# Patient Record
Sex: Male | Born: 1965 | Race: Black or African American | Hispanic: No | Marital: Single | Smoking: Never smoker
Health system: Southern US, Community
[De-identification: ages and names within clinical notes are randomized; demographics above are authoritative.]

## PROBLEM LIST (undated history)

## (undated) DIAGNOSIS — N400 Enlarged prostate without lower urinary tract symptoms: Secondary | ICD-10-CM

## (undated) DIAGNOSIS — Z86718 Personal history of other venous thrombosis and embolism: Secondary | ICD-10-CM

## (undated) HISTORY — DX: Benign prostatic hyperplasia without lower urinary tract symptoms: N40.0

## (undated) HISTORY — DX: Personal history of other venous thrombosis and embolism: Z86.718

## (undated) HISTORY — PX: EYE SURGERY: SHX253

---

## 2008-10-08 ENCOUNTER — Emergency Department: Payer: Self-pay

## 2008-12-24 ENCOUNTER — Emergency Department: Payer: Self-pay | Admitting: Emergency Medicine

## 2009-01-07 ENCOUNTER — Emergency Department: Payer: Self-pay | Admitting: Emergency Medicine

## 2009-11-16 ENCOUNTER — Emergency Department: Payer: Self-pay | Admitting: Emergency Medicine

## 2013-05-14 ENCOUNTER — Ambulatory Visit: Payer: Self-pay

## 2014-07-22 ENCOUNTER — Emergency Department: Payer: Self-pay | Admitting: Emergency Medicine

## 2014-07-22 LAB — CBC WITH DIFFERENTIAL/PLATELET
BASOS ABS: 0 10*3/uL (ref 0.0–0.1)
Basophil %: 0.9 %
Eosinophil #: 0.2 10*3/uL (ref 0.0–0.7)
Eosinophil %: 3.7 %
HCT: 44 % (ref 40.0–52.0)
HGB: 14.8 g/dL (ref 13.0–18.0)
Lymphocyte #: 1.6 10*3/uL (ref 1.0–3.6)
Lymphocyte %: 35.8 %
MCH: 29.6 pg (ref 26.0–34.0)
MCHC: 33.6 g/dL (ref 32.0–36.0)
MCV: 88 fL (ref 80–100)
MONO ABS: 0.3 x10 3/mm (ref 0.2–1.0)
MONOS PCT: 7.3 %
NEUTROS ABS: 2.4 10*3/uL (ref 1.4–6.5)
NEUTROS PCT: 52.3 %
Platelet: 240 10*3/uL (ref 150–440)
RBC: 5 10*6/uL (ref 4.40–5.90)
RDW: 13.6 % (ref 11.5–14.5)
WBC: 4.6 10*3/uL (ref 3.8–10.6)

## 2014-07-22 LAB — BASIC METABOLIC PANEL
Anion Gap: 8 (ref 7–16)
BUN: 16 mg/dL (ref 7–18)
CHLORIDE: 107 mmol/L (ref 98–107)
CO2: 24 mmol/L (ref 21–32)
Calcium, Total: 8.3 mg/dL — ABNORMAL LOW (ref 8.5–10.1)
Creatinine: 1.21 mg/dL (ref 0.60–1.30)
EGFR (African American): >60
EGFR (Non-African Amer.): >60
Glucose: 117 mg/dL — ABNORMAL HIGH (ref 65–99)
OSMOLALITY: 280 (ref 275–301)
POTASSIUM: 3.7 mmol/L (ref 3.5–5.1)
Sodium: 139 mmol/L (ref 136–145)

## 2015-08-22 ENCOUNTER — Ambulatory Visit
Admission: EM | Admit: 2015-08-22 | Discharge: 2015-08-22 | Disposition: A | Discharge status: Home / Self Care | Payer: Self-pay | Attending: Family Medicine | Admitting: Family Medicine

## 2015-08-22 DIAGNOSIS — R634 Abnormal weight loss: Secondary | ICD-10-CM

## 2015-08-22 LAB — GLUCOSE, CAPILLARY: Glucose-Capillary: 86 mg/dL (ref 65–99)

## 2015-08-22 NOTE — ED Provider Notes (Signed)
CSN: [id number]     Arrival date & time 08/22/15  1350 History   None    Chief Complaint  Patient presents with  . Hyperglycemia    Pt asking for a FSBS to check to see if it is normal. He has never been diagnosed with Diabetes, but thinks he has lost weight recently but hasn't weighed himself. He thinks he might be becoming Diabetic and his mother is diabetic. Weight in triage is 15lbs lower than his usual   (Consider location/radiation/quality/duration/timing/severity/associated sxs/prior Treatment) HPI Comments: 50 yo male presents with a concern for diabetes; states his mother is diabetic and would like to have his blood sugar checked. States over the past month he's had about a 12 pounds weight loss, but also has changed his diet and stopped drinking ocassional alcohol. Patient not sure if weight loss due to his diet changes or if he may have diabetes. Denies polyuria, polydipsia, polyphagia. Last ate or drank anything about 7 hours ago.   The history is provided by the patient.    History reviewed. No pertinent past medical history. History reviewed. No pertinent past surgical history. History reviewed. No pertinent family history. Social History  Substance Use Topics  . Smoking status: Never Smoker   . Smokeless tobacco: None  . Alcohol Use: No     Comment: Quit a month ago    Review of Systems  Allergies  Review of patient's allergies indicates no known allergies.  Home Medications   Prior to Admission medications   Not on File   Meds Ordered and Administered this Visit  Medications - No data to display  BP 127/81 mmHg  Pulse 81  Temp(Src) 97.1 F (36.2 C) (Oral)  Resp 18  Ht  (1.88 m)  Wt 233 lb (105.688 kg)  BMI 29.90 kg/m2  SpO2 98% No data found.   Physical Exam  Constitutional: He appears well-developed and well-nourished. No distress.  HENT:  Head: Normocephalic and atraumatic.  Right Ear: Tympanic membrane, external ear and ear canal normal.   Left Ear: Tympanic membrane, external ear and ear canal normal.  Nose: Nose normal.  Mouth/Throat: Uvula is midline, oropharynx is clear and moist and mucous membranes are normal. No oropharyngeal exudate or tonsillar abscesses.  Eyes: Conjunctivae and EOM are normal. Pupils are equal, round, and reactive to light. Right eye exhibits no discharge. Left eye exhibits no discharge. No scleral icterus.  Neck: Normal range of motion. Neck supple. No tracheal deviation present. No thyromegaly present.  Cardiovascular: Normal rate, regular rhythm and normal heart sounds.   Pulmonary/Chest: Effort normal and breath sounds normal. No stridor. No respiratory distress. He has no wheezes. He has no rales. He exhibits no tenderness.  Abdominal: Soft. Bowel sounds are normal. He exhibits no distension.  Musculoskeletal: He exhibits no edema.  Lymphadenopathy:    He has no cervical adenopathy.  Neurological: He is alert.  Skin: Skin is warm and dry. No rash noted. He is not diaphoretic.  Nursing note and vitals reviewed.   ED Course  Procedures (including critical care time)  Labs Review Labs Reviewed  GLUCOSE, CAPILLARY  CBG MONITORING, ED    Imaging Review No results found.   Visual Acuity Review  Right Eye Distance:   Left Eye Distance:   Bilateral Distance:    Right Eye Near:   Left Eye Near:    Bilateral Near:      Fingerstick blood glucose: 86   MDM   1. Weight loss  Likely due to recent dietary modifications. Discussed blood glucose result with patient. Recommend patient establish care with a PCP for further follow up/work up.    Payton Mccallum, MD 08/22/15 [phone]

## 2016-04-14 ENCOUNTER — Ambulatory Visit (INDEPENDENT_AMBULATORY_CARE_PROVIDER_SITE_OTHER): Payer: BLUE CROSS/BLUE SHIELD | Admitting: Unknown Physician Specialty

## 2016-04-14 ENCOUNTER — Encounter: Payer: Self-pay | Admitting: Unknown Physician Specialty

## 2016-04-14 VITALS — BP 129/80 | HR 83 | Temp 98.0°F | Ht 73.9 in | Wt 244.2 lb

## 2016-04-14 DIAGNOSIS — Z0000 Encounter for general adult medical examination without abnormal findings: Secondary | ICD-10-CM

## 2016-04-14 NOTE — Progress Notes (Signed)
BP 129/80 (BP Location: Left Arm, Patient Position: Sitting, Cuff Size: Large)   Pulse 83   Temp 98 F (36.7 C)   Ht 6' 1.9" (1.877 m)   Wt 244 lb 3.2 oz (110.8 kg)   SpO2 98%   BMI 31.44 kg/m    Subjective:    Patient ID: Juan Russell, male    DOB: 10-Apr-1966, 50 y.o.   MRN: [id number]  HPI: Juan Russell is a 50 y.o. male  Chief Complaint  Patient presents with  . Labs Only    pt states he would like his PSA and sugar checked   Pt states he would like his blood sugar and PSA checked.  He does not want a physical exam.  He is concerned about diabetes history in the family  Social History   Social History  . Marital status: Single    Spouse name: N/A  . Number of children: N/A  . Years of education: N/A   Occupational History  . Not on file.   Social History Main Topics  . Smoking status: Never Smoker  . Smokeless tobacco: Never Used  . Alcohol use No     Comment: Quit a month ago  . Drug use: No  . Sexual activity: Not Currently   Other Topics Concern  . Not on file   Social History Narrative  . No narrative on file   Family History  Problem Relation Age of Onset  . Diabetes Mother   . Alcohol abuse Father   . Stroke Father   . Asthma Brother   . Hypertension Brother   . Cirrhosis Brother    Past Medical History:  Diagnosis Date  . BPH (benign prostatic hyperplasia)    Past Surgical History:  Procedure Laterality Date  . EYE SURGERY     as a child    Relevant past medical, surgical, family and social history reviewed and updated as indicated. Interim medical history since our last visit reviewed. Allergies and medications reviewed and updated.  Review of Systems  Constitutional: Negative.   HENT: Negative.   Eyes: Negative.   Respiratory: Negative.   Cardiovascular: Negative.   Gastrointestinal: Negative.   Endocrine: Negative.   Genitourinary: Negative.   Skin: Negative.   Allergic/Immunologic: Negative.   Neurological: Negative.     Hematological: Negative.   Psychiatric/Behavioral: Negative.     Per HPI unless specifically indicated above     Objective:    BP 129/80 (BP Location: Left Arm, Patient Position: Sitting, Cuff Size: Large)   Pulse 83   Temp 98 F (36.7 C)   Ht 6' 1.9" (1.877 m)   Wt 244 lb 3.2 oz (110.8 kg)   SpO2 98%   BMI 31.44 kg/m   Wt Readings from Last 3 Encounters:  04/14/16 244 lb 3.2 oz (110.8 kg)  04/16/13 244 lb (110.7 kg)  08/22/15 233 lb (105.7 kg)    Physical Exam  Constitutional: He is oriented to person, place, and time. He appears well-developed and well-nourished. No distress.  HENT:  Head: Normocephalic and atraumatic.  Eyes: Conjunctivae and lids are normal. Right eye exhibits no discharge. Left eye exhibits no discharge. No scleral icterus.  Neck: Normal range of motion. Neck supple. No JVD present. Carotid bruit is not present.  Cardiovascular: Normal rate, regular rhythm and normal heart sounds.   Pulmonary/Chest: Effort normal and breath sounds normal. No respiratory distress.  Abdominal: Normal appearance. There is no splenomegaly or hepatomegaly.  Musculoskeletal: Normal range of  motion.  Neurological: He is alert and oriented to person, place, and time.  Skin: Skin is warm, dry and intact. No rash noted. No pallor.  Psychiatric: He has a normal mood and affect. His behavior is normal. Judgment and thought content normal.    Results for orders placed or performed during the hospital encounter of 08/22/15  Glucose, capillary  Result Value Ref Range   Glucose-Capillary 86 65 - 99 mg/dL      Assessment & Plan:   Problem List Items Addressed This Visit    None    Visit Diagnoses    Annual physical exam    -  Primary   Relevant Orders   CBC with Differential/Platelet   Comprehensive metabolic panel   Lipid Panel w/o Chol/HDL Ratio   PSA   TSH       Follow up plan: Return in about 1 year (around 04/14/2017).

## 2016-04-15 LAB — TSH: TSH: 1.85 u[IU]/mL (ref 0.450–4.500)

## 2016-04-15 LAB — COMPREHENSIVE METABOLIC PANEL
A/G RATIO: 1.6 (ref 1.2–2.2)
ALBUMIN: 4.1 g/dL (ref 3.5–5.5)
ALK PHOS: 90 IU/L (ref 39–117)
ALT: 20 IU/L (ref 0–44)
AST: 20 IU/L (ref 0–40)
BILIRUBIN TOTAL: 0.3 mg/dL (ref 0.0–1.2)
BUN / CREAT RATIO: 16 (ref 9–20)
BUN: 21 mg/dL (ref 6–24)
CHLORIDE: 103 mmol/L (ref 96–106)
CO2: 23 mmol/L (ref 18–29)
CREATININE: 1.28 mg/dL — AB (ref 0.76–1.27)
Calcium: 9.1 mg/dL (ref 8.7–10.2)
GFR calc Af Amer: 75 mL/min/{1.73_m2} (ref >59)
GFR calc non Af Amer: 65 mL/min/{1.73_m2} (ref >59)
GLOBULIN, TOTAL: 2.6 g/dL (ref 1.5–4.5)
Glucose: 129 mg/dL — ABNORMAL HIGH (ref 65–99)
POTASSIUM: 4 mmol/L (ref 3.5–5.2)
SODIUM: 142 mmol/L (ref 134–144)
Total Protein: 6.7 g/dL (ref 6.0–8.5)

## 2016-04-15 LAB — CBC WITH DIFFERENTIAL/PLATELET
Basophils Absolute: 0 10*3/uL (ref 0.0–0.2)
Basos: 0 %
EOS (ABSOLUTE): 0.2 10*3/uL (ref 0.0–0.4)
Eos: 4 %
HEMOGLOBIN: 13.8 g/dL (ref 12.6–17.7)
Hematocrit: 40 % (ref 37.5–51.0)
Immature Grans (Abs): 0 10*3/uL (ref 0.0–0.1)
Immature Granulocytes: 1 %
LYMPHS: 39 %
Lymphocytes Absolute: 1.6 10*3/uL (ref 0.7–3.1)
MCH: 28.8 pg (ref 26.6–33.0)
MCHC: 34.5 g/dL (ref 31.5–35.7)
MCV: 84 fL (ref 79–97)
MONOCYTES: 9 %
Monocytes Absolute: 0.4 10*3/uL (ref 0.1–0.9)
Neutrophils Absolute: 2 10*3/uL (ref 1.4–7.0)
Neutrophils: 47 %
Platelets: 261 10*3/uL (ref 150–379)
RBC: 4.79 x10E6/uL (ref 4.14–5.80)
RDW: 14.2 % (ref 12.3–15.4)
WBC: 4.1 10*3/uL (ref 3.4–10.8)

## 2016-04-15 LAB — LIPID PANEL W/O CHOL/HDL RATIO
CHOLESTEROL TOTAL: 225 mg/dL — AB (ref 100–199)
HDL: 42 mg/dL (ref >39)
LDL CALC: 145 mg/dL — AB (ref 0–99)
Triglycerides: 189 mg/dL — ABNORMAL HIGH (ref 0–149)
VLDL Cholesterol Cal: 38 mg/dL (ref 5–40)

## 2016-04-15 LAB — PSA: Prostate Specific Ag, Serum: 0.4 ng/mL (ref 0.0–4.0)

## 2016-04-17 ENCOUNTER — Encounter: Payer: Self-pay | Admitting: Unknown Physician Specialty

## 2016-04-28 ENCOUNTER — Telehealth: Payer: Self-pay | Admitting: Unknown Physician Specialty

## 2016-04-28 NOTE — Telephone Encounter (Signed)
Letter was mailed to patient. Left message to call.

## 2016-04-28 NOTE — Telephone Encounter (Signed)
Pt called stated he would a call back. Pt would like to know lab results. Please call ASAP. Thanks.

## 2016-05-01 NOTE — Telephone Encounter (Signed)
Left message to call.

## 2017-04-20 ENCOUNTER — Encounter: Payer: BLUE CROSS/BLUE SHIELD | Admitting: Unknown Physician Specialty

## 2017-06-27 ENCOUNTER — Ambulatory Visit: Payer: BLUE CROSS/BLUE SHIELD | Admitting: Unknown Physician Specialty

## 2017-06-27 ENCOUNTER — Encounter: Payer: Self-pay | Admitting: Unknown Physician Specialty

## 2017-06-27 DIAGNOSIS — E7800 Pure hypercholesterolemia, unspecified: Secondary | ICD-10-CM | POA: Diagnosis not present

## 2017-06-27 DIAGNOSIS — Z125 Encounter for screening for malignant neoplasm of prostate: Secondary | ICD-10-CM | POA: Diagnosis not present

## 2017-06-27 DIAGNOSIS — I10 Essential (primary) hypertension: Secondary | ICD-10-CM

## 2017-06-27 NOTE — Progress Notes (Signed)
BP 129/87 (BP Location: Left Arm, Cuff Size: Large)   Pulse 72   Temp 97.6 F (36.4 C) (Oral)   Wt 254 lb 4.8 oz (115.3 kg)   SpO2 98%   BMI 32.74 kg/m    Subjective:    Patient ID: Juan Russell, male    DOB: 07/30/1965, 51 y.o.   MRN: [id number]  HPI: Juan StammerJay V Weismann is a 51 y.o. male  Chief Complaint  Patient presents with  . Labs Only    pt states he wants to have his PSA checked. States he saw some blood on his underwear last week, not sure if it was from rectum or penis. States he only saw the blood once and has no urinary symptoms/   Hypertension High today.  States it's from [place] the [employer] twice a week Using medications without difficulty Average home BPs Not checking  No problems or lightheadedness No chest pain with exertion or shortness of breath No Edema   Hyperlipidemia No medications  The 10-year ASCVD risk score Denman George(Goff DC Jr., et al., 2013) is: 5.9%   Values used to calculate the score:     Age: [age] years     Sex: Male     Is Non-Hispanic African American: Yes     Diabetic: No     Tobacco smoker: No     Systolic Blood Pressure: 129 mmHg     Is BP treated: No     HDL Cholesterol: 42 mg/dL     Total Cholesterol: 225 mg/dL   Relevant past medical, surgical, family and social history reviewed and updated as indicated. Interim medical history since our last visit reviewed. Allergies and medications reviewed and updated.  Review of Systems  Per HPI unless specifically indicated above     Objective:    BP 129/87 (BP Location: Left Arm, Cuff Size: Large)   Pulse 72   Temp 97.6 F (36.4 C) (Oral)   Wt 254 lb 4.8 oz (115.3 kg)   SpO2 98%   BMI 32.74 kg/m   Wt Readings from Last 3 Encounters:  06/27/17 254 lb 4.8 oz (115.3 kg)  04/14/16 244 lb 3.2 oz (110.8 kg)  04/16/13 244 lb (110.7 kg)    Physical Exam  Constitutional: He is oriented to person, place, and time. He appears well-developed and well-nourished. No distress.  HENT:   Head: Normocephalic and atraumatic.  Eyes: Conjunctivae and lids are normal. Right eye exhibits no discharge. Left eye exhibits no discharge. No scleral icterus.  Neck: Normal range of motion. Neck supple. No JVD present. Carotid bruit is not present.  Cardiovascular: Normal rate, regular rhythm and normal heart sounds.  Pulmonary/Chest: Effort normal and breath sounds normal. No respiratory distress.  Abdominal: Normal appearance. There is no splenomegaly or hepatomegaly.  Musculoskeletal: Normal range of motion.  Neurological: He is alert and oriented to person, place, and time.  Skin: Skin is warm, dry and intact. No rash noted. No pallor.  Psychiatric: He has a normal mood and affect. His behavior is normal. Judgment and thought content normal.    Results for orders placed or performed in visit on 04/14/16  CBC with Differential/Platelet  Result Value Ref Range   WBC 4.1 3.4 - 10.8 x10E3/uL   RBC 4.79 4.14 - 5.80 x10E6/uL   Hemoglobin 13.8 12.6 - 17.7 g/dL   Hematocrit 40.940.0 81.137.5 - 51.0 %   MCV 84 79 - 97 fL   MCH 28.8 26.6 - 33.0 pg  MCHC 34.5 31.5 - 35.7 g/dL   RDW 16.114.2 09.612.3 - 04.515.4 %   Platelets 261 150 - 379 x10E3/uL   Neutrophils 47 Not Estab. %   Lymphs 39 Not Estab. %   Monocytes 9 Not Estab. %   Eos 4 Not Estab. %   Basos 0 Not Estab. %   Neutrophils Absolute 2.0 1.4 - 7.0 x10E3/uL   Lymphocytes Absolute 1.6 0.7 - 3.1 x10E3/uL   Monocytes Absolute 0.4 0.1 - 0.9 x10E3/uL   EOS (ABSOLUTE) 0.2 0.0 - 0.4 x10E3/uL   Basophils Absolute 0.0 0.0 - 0.2 x10E3/uL   Immature Granulocytes 1 Not Estab. %   Immature Grans (Abs) 0.0 0.0 - 0.1 x10E3/uL  Comprehensive metabolic panel  Result Value Ref Range   Glucose 129 (H) 65 - 99 mg/dL   BUN 21 6 - 24 mg/dL   Creatinine, Ser 4.091.28 (H) 0.76 - 1.27 mg/dL   GFR calc non Af Amer 65 >59 mL/min/1.73   GFR calc Af Amer 75 >59 mL/min/1.73   BUN/Creatinine Ratio 16 9 - 20   Sodium 142 134 - 144 mmol/L   Potassium 4.0 3.5 - 5.2 mmol/L    Chloride 103 96 - 106 mmol/L   CO2 23 18 - 29 mmol/L   Calcium 9.1 8.7 - 10.2 mg/dL   Total Protein 6.7 6.0 - 8.5 g/dL   Albumin 4.1 3.5 - 5.5 g/dL   Globulin, Total 2.6 1.5 - 4.5 g/dL   Albumin/Globulin Ratio 1.6 1.2 - 2.2   Bilirubin Total 0.3 0.0 - 1.2 mg/dL   Alkaline Phosphatase 90 39 - 117 IU/L   AST 20 0 - 40 IU/L   ALT 20 0 - 44 IU/L  Lipid Panel w/o Chol/HDL Ratio  Result Value Ref Range   Cholesterol, Total 225 (H) 100 - 199 mg/dL   Triglycerides 811189 (H) 0 - 149 mg/dL   HDL 42 >91>39 mg/dL   VLDL Cholesterol Cal 38 5 - 40 mg/dL   LDL Calculated 478145 (H) 0 - 99 mg/dL  PSA  Result Value Ref Range   Prostate Specific Ag, Serum 0.4 0.0 - 4.0 ng/mL  TSH  Result Value Ref Range   TSH 1.850 0.450 - 4.500 uIU/mL      Assessment & Plan:   Problem List Items Addressed This Visit      Unprioritized   Hypercholesteremia    LDL over 145 last visit      Relevant Orders   Lipid Panel w/o Chol/HDL Ratio   Hypertension    Elevated today.  This is the first time.  Encouraged to check at home      Prostate cancer screening   Relevant Orders   PSA       Follow up plan: Return in about 1 year (around 06/27/2018).

## 2017-06-27 NOTE — Assessment & Plan Note (Addendum)
Elevated today.  This is the first time.  Encouraged to check at home and to RTC if continued elevations.  /DASH diet

## 2017-06-27 NOTE — Assessment & Plan Note (Signed)
LDL over 145 last visit

## 2017-06-27 NOTE — Patient Instructions (Addendum)
DASH Eating Plan DASH stands for "Dietary Approaches to Stop Hypertension." The DASH eating plan is a healthy eating plan that has been shown to reduce high blood pressure (hypertension). It may also reduce your risk for type 2 diabetes, heart disease, and stroke. The DASH eating plan may also help with weight loss. What are tips for following this plan? General guidelines  Avoid eating more than 2,300 mg (milligrams) of salt (sodium) a day. If you have hypertension, you may need to reduce your sodium intake to 1,500 mg a day.  Limit alcohol intake to no more than 1 drink a day for nonpregnant women and 2 drinks a day for men. One drink equals 12 oz of beer, 5 oz of wine, or 1 oz of hard liquor.  Work with your health care provider to maintain a healthy body weight or to lose weight. Ask what an ideal weight is for you.  Get at least 30 minutes of exercise that causes your heart to beat faster (aerobic exercise) most days of the week. Activities may include walking, swimming, or biking.  Work with your health care provider or diet and nutrition specialist (dietitian) to adjust your eating plan to your individual calorie needs. Reading food labels  Check food labels for the amount of sodium per serving. Choose foods with less than 5 percent of the Daily Value of sodium. Generally, foods with less than 300 mg of sodium per serving fit into this eating plan.  To find whole grains, look for the word "whole" as the first word in the ingredient list. Shopping  Buy products labeled as "low-sodium" or "no salt added."  Buy fresh foods. Avoid canned foods and premade or frozen meals. Cooking  Avoid adding salt when cooking. Use salt-free seasonings or herbs instead of table salt or sea salt. Check with your health care provider or pharmacist before using salt substitutes.  Do not fry foods. Cook foods using healthy methods such as baking, boiling, grilling, and broiling instead.  Cook with  heart-healthy oils, such as olive, canola, soybean, or sunflower oil. Meal planning   Eat a balanced diet that includes: ? 5 or more servings of fruits and vegetables each day. At each meal, try to fill half of your plate with fruits and vegetables. ? Up to 6-8 servings of whole grains each day. ? Less than 6 oz of lean meat, poultry, or fish each day. A 3-oz serving of meat is about the same size as a deck of cards. One egg equals 1 oz. ? 2 servings of low-fat dairy each day. ? A serving of nuts, seeds, or beans 5 times each week. ? Heart-healthy fats. Healthy fats called Omega-3 fatty acids are found in foods such as flaxseeds and coldwater fish, like sardines, salmon, and mackerel.  Limit how much you eat of the following: ? Canned or prepackaged foods. ? Food that is high in trans fat, such as fried foods. ? Food that is high in saturated fat, such as fatty meat. ? Sweets, desserts, sugary drinks, and other foods with added sugar. ? Full-fat dairy products.  Do not salt foods before eating.  Try to eat at least 2 vegetarian meals each week.  Eat more home-cooked food and less restaurant, buffet, and fast food.  When eating at a restaurant, ask that your food be prepared with less salt or no salt, if possible. What foods are recommended? The items listed may not be a complete list. Talk with your dietitian about what   dietary choices are best for you. Grains Whole-grain or whole-wheat bread. Whole-grain or whole-wheat pasta. Brown rice. Oatmeal. Quinoa. Bulgur. Whole-grain and low-sodium cereals. Pita bread. Low-fat, low-sodium crackers. Whole-wheat flour tortillas. Vegetables Fresh or frozen vegetables (raw, steamed, roasted, or grilled). Low-sodium or reduced-sodium tomato and vegetable juice. Low-sodium or reduced-sodium tomato sauce and tomato paste. Low-sodium or reduced-sodium canned vegetables. Fruits All fresh, dried, or frozen fruit. Canned fruit in natural juice (without  added sugar). Meat and other protein foods Skinless chicken or turkey. Ground chicken or turkey. Pork with fat trimmed off. Fish and seafood. Egg whites. Dried beans, peas, or lentils. Unsalted nuts, nut butters, and seeds. Unsalted canned beans. Lean cuts of beef with fat trimmed off. Low-sodium, lean deli meat. Dairy Low-fat (1%) or fat-free (skim) milk. Fat-free, low-fat, or reduced-fat cheeses. Nonfat, low-sodium ricotta or cottage cheese. Low-fat or nonfat yogurt. Low-fat, low-sodium cheese. Fats and oils Soft margarine without trans fats. Vegetable oil. Low-fat, reduced-fat, or light mayonnaise and salad dressings (reduced-sodium). Canola, safflower, olive, soybean, and sunflower oils. Avocado. Seasoning and other foods Herbs. Spices. Seasoning mixes without salt. Unsalted popcorn and pretzels. Fat-free sweets. What foods are not recommended? The items listed may not be a complete list. Talk with your dietitian about what dietary choices are best for you. Grains Baked goods made with fat, such as croissants, muffins, or some breads. Dry pasta or rice meal packs. Vegetables Creamed or fried vegetables. Vegetables in a cheese sauce. Regular canned vegetables (not low-sodium or reduced-sodium). Regular canned tomato sauce and paste (not low-sodium or reduced-sodium). Regular tomato and vegetable juice (not low-sodium or reduced-sodium). Pickles. Olives. Fruits Canned fruit in a light or heavy syrup. Fried fruit. Fruit in cream or butter sauce. Meat and other protein foods Fatty cuts of meat. Ribs. Fried meat. Bacon. Sausage. Bologna and other processed lunch meats. Salami. Fatback. Hotdogs. Bratwurst. Salted nuts and seeds. Canned beans with added salt. Canned or smoked fish. Whole eggs or egg yolks. Chicken or turkey with skin. Dairy Whole or 2% milk, cream, and half-and-half. Whole or full-fat cream cheese. Whole-fat or sweetened yogurt. Full-fat cheese. Nondairy creamers. Whipped toppings.  Processed cheese and cheese spreads. Fats and oils Butter. Stick margarine. Lard. Shortening. Ghee. Bacon fat. Tropical oils, such as coconut, palm kernel, or palm oil. Seasoning and other foods Salted popcorn and pretzels. Onion salt, garlic salt, seasoned salt, table salt, and sea salt. Worcestershire sauce. Tartar sauce. Barbecue sauce. Teriyaki sauce. Soy sauce, including reduced-sodium. Steak sauce. Canned and packaged gravies. Fish sauce. Oyster sauce. Cocktail sauce. Horseradish that you find on the shelf. Ketchup. Mustard. Meat flavorings and tenderizers. Bouillon cubes. Hot sauce and Tabasco sauce. Premade or packaged marinades. Premade or packaged taco seasonings. Relishes. Regular salad dressings. Where to find more information:  National Heart, Lung, and Blood Institute: www.nhlbi.nih.gov  American Heart Association: www.heart.org Summary  The DASH eating plan is a healthy eating plan that has been shown to reduce high blood pressure (hypertension). It may also reduce your risk for type 2 diabetes, heart disease, and stroke.  With the DASH eating plan, you should limit salt (sodium) intake to 2,300 mg a day. If you have hypertension, you may need to reduce your sodium intake to 1,500 mg a day.  When on the DASH eating plan, aim to eat more fresh fruits and vegetables, whole grains, lean proteins, low-fat dairy, and heart-healthy fats.  Work with your health care provider or diet and nutrition specialist (dietitian) to adjust your eating plan to your individual   calorie needs. This information is not intended to replace advice given to you by your health care provider. Make sure you discuss any questions you have with your health care provider. Document Released: 06/15/2011 Document Revised: 06/19/2016 Document Reviewed: 06/19/2016 Elsevier Interactive Patient Education  2018 [employer]  

## 2017-06-28 LAB — PSA: Prostate Specific Ag, Serum: 0.4 ng/mL (ref 0.0–4.0)

## 2017-06-28 LAB — LIPID PANEL W/O CHOL/HDL RATIO
CHOLESTEROL TOTAL: 237 mg/dL — AB (ref 100–199)
HDL: 43 mg/dL (ref >39)
LDL CALC: 156 mg/dL — AB (ref 0–99)
TRIGLYCERIDES: 190 mg/dL — AB (ref 0–149)
VLDL CHOLESTEROL CAL: 38 mg/dL (ref 5–40)

## 2017-06-29 ENCOUNTER — Encounter: Payer: Self-pay | Admitting: Unknown Physician Specialty

## 2018-06-28 ENCOUNTER — Encounter: Payer: BLUE CROSS/BLUE SHIELD | Admitting: Unknown Physician Specialty

## 2018-06-28 ENCOUNTER — Encounter: Payer: BLUE CROSS/BLUE SHIELD | Admitting: Nurse Practitioner

## 2018-07-01 ENCOUNTER — Encounter: Payer: Self-pay | Admitting: Nurse Practitioner

## 2018-07-01 ENCOUNTER — Ambulatory Visit (INDEPENDENT_AMBULATORY_CARE_PROVIDER_SITE_OTHER): Payer: Self-pay | Admitting: Nurse Practitioner

## 2018-07-01 ENCOUNTER — Other Ambulatory Visit: Payer: Self-pay

## 2018-07-01 VITALS — BP 118/80 | HR 75 | Temp 98.5°F | Ht 73.0 in | Wt 250.0 lb

## 2018-07-01 DIAGNOSIS — Z1211 Encounter for screening for malignant neoplasm of colon: Secondary | ICD-10-CM

## 2018-07-01 DIAGNOSIS — E669 Obesity, unspecified: Secondary | ICD-10-CM | POA: Insufficient documentation

## 2018-07-01 DIAGNOSIS — Z6832 Body mass index (BMI) 32.0-32.9, adult: Secondary | ICD-10-CM | POA: Insufficient documentation

## 2018-07-01 DIAGNOSIS — E7800 Pure hypercholesterolemia, unspecified: Secondary | ICD-10-CM

## 2018-07-01 DIAGNOSIS — I10 Essential (primary) hypertension: Secondary | ICD-10-CM

## 2018-07-01 DIAGNOSIS — Z125 Encounter for screening for malignant neoplasm of prostate: Secondary | ICD-10-CM

## 2018-07-01 MED ORDER — ZOSTER VAC RECOMB ADJUVANTED 50 MCG/0.5ML IM SUSR: 0.5000 mL | Freq: Once | INTRAMUSCULAR | 0 refills | Status: AC

## 2018-07-01 MED ORDER — DOXYCYCLINE HYCLATE 100 MG PO TABS: 100.0000 mg | ORAL_TABLET | Freq: Two times a day (BID) | ORAL | 0 refills | Status: DC

## 2018-07-01 NOTE — Assessment & Plan Note (Signed)
Below goal without medication.  Continue to monitor and continue supplements at home.

## 2018-07-01 NOTE — Patient Instructions (Signed)
Red Yeast Rice capsules  What is this medicine?  RED YEAST RICE (red yeest rahys) is intended to be used by healthy adults to help lower blood cholesterol in conjunction with a healthy diet and a regular exercise program. The FDA has not approved this supplement for any medical use. If medical treatment is needed for cholesterol control or any other disease, you should contact your doctor or health care professional regarding the use of this product.  This supplement may be used for other purposes; ask your health care provider or pharmacist if you have questions.  This medicine may be used for other purposes; ask your health care provider or pharmacist if you have questions.  COMMON BRAND NAME(S): Red Yeast Rice  What should I tell my health care provider before I take this medicine?  They need to know if you have any of these conditions:  -frequently drink alcoholic beverages  -kidney disease  -liver disease  -muscle aches or weakness  -other medical condition  -an unusual or allergic reaction to red yeast rice, went yeast, lovastatin, other 'statin' medications, other medicines, foods, dyes, or preservatives  -pregnant or trying to get pregnant  -breast-feeding  How should I use this medicine?  Take this supplement by mouth with a glass of water. Follow the directions on the package labeling, or take as directed by your health care professional. Do not take this supplement more often than directed.  Contact your pediatrician or health care professional regarding the use of this supplement in children. Special care may be needed. This supplement is not recommended for use in children.  Overdosage: If you think you have taken too much of this medicine contact a poison control center or emergency room at once.  NOTE: This medicine is only for you. Do not share this medicine with others.  What if I miss a dose?  If you miss a dose, take it as soon as you can. If it is almost time for your next dose, take only that  dose. Do not take double or extra doses.  What may interact with this medicine?  Do not take this medicine with any of the following medications:  -clarithromycin  -delavirdine  -erythromycin  -grapefruit juice  -protease inhibitors used to treat HIV infection  -medicines for fungal infections like itraconazole, ketoconazole, posaconazole, and voriconazole  -mibefradil  -nefazodone  -other medicines for high cholesterol  -telithromycin  -troleandomycin  This medicine may also interact with the following medications:  -alcohol  -amiodarone  -colchicine  -cyclosporine  -danazol  -diltiazem  -fenofibrate  -fluconazole  -gemfibrozil  -mifepristone, RU-486  -niacin  -St. John's wort  -verapamil  -voriconazole  -warfarin  This list may not describe all possible interactions. Give your health care provider a list of all the medicines, herbs, non-prescription drugs, or dietary supplements you use. Also tell them if you smoke, drink alcohol, or use illegal drugs. Some items may interact with your medicine.  What should I watch for while using this medicine?  Visit your doctor or health care professional for regular check-ups. You may need regular tests to make sure your liver is working properly.  Tell you doctor or health care professional right away if you get any unexplained muscle pain, tenderness, or weakness, especially if you also have a fever and tiredness.  Some drugs may increase the risk of side effects from this supplement. If you are given certain antibiotics or antifungals, you should stop taking this supplement during those treatments. Check with   your doctor or pharmacist for advice.  If you are scheduled for any medical or dental procedure, tell your healthcare provider that you are taking this supplement. You may need to stop taking this supplement before the procedure.  Do not use this drug if you are pregnant or breast-feeding. Serious side effects to an unborn child or to an infant are possible. Talk to  your doctor or pharmacist for more information.  Herbal or dietary supplements are not regulated like medicines. Rigid quality control standards are not required for dietary supplements. The purity and strength of these products can vary. The safety and effect of this dietary supplement for a certain disease or illness is not well known. This product is not intended to diagnose, treat, cure or prevent any disease.  The Food and Drug Administration suggests the following to help consumers protect themselves:  -Always read product labels and follow directions.  -Natural does not mean a product is safe for humans to take.  -Look for products that include USP after the ingredient name. This means that the manufacturer followed the standards of the [place] Pharmacopoeia.  -Supplements made or sold by a nationally known food or drug company are more likely to be made under tight controls. You can write to the company for more information about how the product was made.  What side effects may I notice from receiving this medicine?  Side effects that you should report to your doctor or health care professional as soon as possible:  -allergic reactions like skin rash, itching or hives, swelling of the face, lips, or tongue  -dark urine  -fever  -joint pain  -muscle cramps, pain  -redness, blistering, peeling or loosening of the skin, including inside the mouth  -trouble passing urine or change in the amount of urine  -unusually weak or tired  -yellowing of the eyes or skin  Side effects that usually do not require medical attention (report to your doctor or health care professional if they continue or are bothersome):  -constipation  -headache  -stomach gas, pain, upset  -nausea  -trouble sleeping  This list may not describe all possible side effects. Call your doctor for medical advice about side effects. You may report side effects to FDA at 1-800-FDA-1088.  Where should I keep my medicine?  Keep out of the reach of  children.  Store at room temperature between 15 and 30 degrees C (59 and 86 degrees F). Throw away any unused medicine after the expiration date.  NOTE: This sheet is a summary. It may not cover all possible information. If you have questions about this medicine, talk to your doctor, pharmacist, or health care provider.   2019 Elsevier/Gold Standard (2014-03-03 10:26:14)

## 2018-07-01 NOTE — Assessment & Plan Note (Signed)
Refuses medications.  Continues supplements at home.  Lipid panel today (non fasting).

## 2018-07-01 NOTE — Assessment & Plan Note (Signed)
Continue to focus on diet and exercise regimen.

## 2018-07-01 NOTE — Progress Notes (Signed)
BP 118/80   Pulse 75   Temp 98.5 F (36.9 C) (Oral)   Ht 6\' 1"  (1.854 m)   Wt 250 lb (113.4 kg)   SpO2 97%   BMI 32.98 kg/m    Subjective:    Patient ID: Juan Russell, male    DOB: 12-18-1965, 52 y.o.   MRN: [id number]  HPI: JOSEEDUARDO Russell is a 52 y.o. male presenting on 07/01/2018 for comprehensive medical examination. Current medical complaints include:ingrown hair to left lower jaw  He currently lives with: Interim Problems from his last visit: no  HYPERTENSION / HYPERLIPIDEMIA No current prescription medications.  Uses supplements. Satisfied with current treatment? No current medications, all natural Duration of hypertension: chronic BP monitoring frequency: not checking BP range:  Duration of hyperlipidemia: chronic, no current medications, uses Garlic and exercises Cholesterol supplements: none Medication compliance: excellent compliance Aspirin: no Recent stressors: no Recurrent headaches: no Visual changes: no Palpitations: no Dyspnea: no Chest pain: no Lower extremity edema: no Dizzy/lightheaded: no   INGROWN HAIR TO LEFT LOWER JAW: Has history of similar presentation.  States this area has been present for past week.  No pain to site.  States he has been "picking at it some, but nothing coming out".  Encouraged him not to pick at area.  Discussed applying warm compresses 3 times a day, states he has tried compresses but not been consistent with applying.  Denies drainage to site or fever.  Reports areas show up on and off at his jaw line with shaving.  Functional Status Survey: Is the patient deaf or have difficulty hearing?: No Does the patient have difficulty concentrating, remembering, or making decisions?: No Does the patient have difficulty walking or climbing stairs?: No Does the patient have difficulty dressing or bathing?: No Does the patient have difficulty doing errands alone such as visiting a doctor's office or shopping?: No  FALL RISK: Fall  Risk  07/01/2018  Falls in the past year? 0  Number falls in past yr: 0  Injury with Fall? 0  Follow up Falls evaluation completed    Depression Screen Depression screen [facility] 2/9 07/01/2018  Decreased Interest 0  Down, Depressed, Hopeless 0  PHQ - 2 Score 0  Altered sleeping 0  Tired, decreased energy 2  Change in appetite 0  Feeling bad or failure about yourself  0  Trouble concentrating 0  Moving slowly or fidgety/restless 0  Suicidal thoughts 0  PHQ-9 Score 2  Difficult doing work/chores Not difficult at all    Advanced Directives None at this time.  Past Medical History:  Past Medical History:  Diagnosis Date  . BPH (benign prostatic hyperplasia)     Surgical History:  Past Surgical History:  Procedure Laterality Date  . EYE SURGERY     as a child    Medications:  Current Outpatient Medications on File Prior to Visit  Medication Sig  . CALCIUM PO Take 600 mg by mouth every other day.  Marland Kitchen CINNAMON PO Take by mouth daily.  Marland Kitchen GARLIC PO Take by mouth daily.  Marland Kitchen VITAMIN D PO Take by mouth daily.   No current facility-administered medications on file prior to visit.     Allergies:  No Known Allergies  Social History:  Social History   Socioeconomic History  . Marital status: Single    Spouse name: Not on file  . Number of children: Not on file  . Years of education: Not on file  . Highest education level: Not  on file  Occupational History  . Not on file  Social Needs  . Financial resource strain: Not on file  . Food insecurity:    Worry: Not on file    Inability: Not on file  . Transportation needs:    Medical: Not on file    Non-medical: Not on file  Tobacco Use  . Smoking status: Never Smoker  . Smokeless tobacco: Never Used  Substance and Sexual Activity  . Alcohol use: No    Comment: Quit a month ago  . Drug use: No  . Sexual activity: Not Currently  Lifestyle  . Physical activity:    Days per week: Not on file    Minutes per session:  Not on file  . Stress: Not on file  Relationships  . Social connections:    Talks on phone: Not on file    Gets together: Not on file    Attends religious service: Not on file    Active member of club or organization: Not on file    Attends meetings of clubs or organizations: Not on file    Relationship status: Not on file  . Intimate partner violence:    Fear of current or ex partner: Not on file    Emotionally abused: Not on file    Physically abused: Not on file    Forced sexual activity: Not on file  Other Topics Concern  . Not on file  Social History Narrative  . Not on file   Social History   Tobacco Use  Smoking Status Never Smoker  Smokeless Tobacco Never Used   Social History   Substance and Sexual Activity  Alcohol Use No   Comment: Quit a month ago    Family History:  Family History  Problem Relation Age of Onset  . Diabetes Mother   . Alcohol abuse Father   . Stroke Father   . Asthma Brother   . Hypertension Brother   . Cirrhosis Brother     Past medical history, surgical history, medications, allergies, family history and social history reviewed with patient today and changes made to appropriate areas of the chart.   Review of Systems - Negative except ingrown hair left lower jawline Dermatological ROS: positive for lumps left lower jaw line All other ROS negative except what is listed above and in the HPI.      Objective:    BP 118/80   Pulse 75   Temp 98.5 F (36.9 C) (Oral)   Ht 6\' 1"  (1.854 m)   Wt 250 lb (113.4 kg)   SpO2 97%   BMI 32.98 kg/m   Wt Readings from Last 3 Encounters:  07/01/18 250 lb (113.4 kg)  06/27/17 254 lb 4.8 oz (115.3 kg)  04/14/16 244 lb 3.2 oz (110.8 kg)    Physical Exam Vitals signs and nursing note reviewed.  Constitutional:      Appearance: He is well-developed and overweight.  HENT:     Head: Normocephalic and atraumatic.     Right Ear: Hearing, tympanic membrane, ear canal and external ear normal. No  decreased hearing noted. No drainage.     Left Ear: Hearing, tympanic membrane, ear canal and external ear normal. No decreased hearing noted. No drainage.     Nose: Nose normal.     Right Sinus: No maxillary sinus tenderness or frontal sinus tenderness.     Left Sinus: No maxillary sinus tenderness or frontal sinus tenderness.     Mouth/Throat:  Pharynx: Uvula midline.  Eyes:     General: Lids are normal.        Right eye: No discharge.        Left eye: No discharge.     Conjunctiva/sclera: Conjunctivae normal.     Pupils: Pupils are equal, round, and reactive to light.  Neck:     Musculoskeletal: Normal range of motion and neck supple.     Thyroid: No thyromegaly.     Vascular: No carotid bruit or JVD.     Trachea: Trachea normal.  Cardiovascular:     Rate and Rhythm: Normal rate and regular rhythm.     Heart sounds: Normal heart sounds, S1 normal and S2 normal. No murmur. No gallop.   Pulmonary:     Effort: Pulmonary effort is normal.     Breath sounds: Normal breath sounds.  Abdominal:     General: Bowel sounds are normal.     Palpations: Abdomen is soft. There is no hepatomegaly or splenomegaly.  Genitourinary:    Comments: Deferred, patient refused Musculoskeletal: Normal range of motion.        General: No swelling.     Right lower leg: No edema.     Left lower leg: No edema.  Lymphadenopathy:     Cervical: No cervical adenopathy.  Skin:    General: Skin is warm and dry.     Capillary Refill: Capillary refill takes less than 2 seconds.     Findings: No rash.     Comments: Small/raised, mild erythema area to left lower jaw line.  Mild erythema, no warmth.  Intact without drainage.  Area firm to touch with no tenderness.  Neurological:     Mental Status: He is alert and oriented to person, place, and time.     Cranial Nerves: No cranial nerve deficit.     Sensory: No sensory deficit.     Deep Tendon Reflexes: Reflexes are normal and symmetric.     Reflex Scores:       Brachioradialis reflexes are 2+ on the right side and 2+ on the left side.      Patellar reflexes are 2+ on the right side and 2+ on the left side. Psychiatric:        Mood and Affect: Mood normal.        Behavior: Behavior normal.        Thought Content: Thought content normal.        Judgment: Judgment normal.      Results for orders placed or performed in visit on 06/27/17  Lipid Panel w/o Chol/HDL Ratio  Result Value Ref Range   Cholesterol, Total 237 (H) 100 - 199 mg/dL   Triglycerides 846190 (H) 0 - 149 mg/dL   HDL 43 >96>39 mg/dL   VLDL Cholesterol Cal 38 5 - 40 mg/dL   LDL Calculated 295156 (H) 0 - 99 mg/dL  PSA  Result Value Ref Range   Prostate Specific Ag, Serum 0.4 0.0 - 4.0 ng/mL      Assessment & Plan:   Problem List Items Addressed This Visit      Other   Prostate cancer screening - Primary   Relevant Orders   PSA   Hypercholesteremia   Relevant Orders   Lipid Panel w/o Chol/HDL Ratio       Discussed aspirin prophylaxis for myocardial infarction prevention and decision was it was not indicated  LABORATORY TESTING:  Health maintenance labs ordered today as discussed above.   The natural history of  prostate cancer and ongoing controversy regarding screening and potential treatment outcomes of prostate cancer has been discussed with the patient. The meaning of a false positive PSA and a false negative PSA has been discussed. He indicates understanding of the limitations of this screening test and wishes to proceed with screening PSA testing.   IMMUNIZATIONS:   - Tdap: Tetanus vaccination status reviewed: up to date - Influenza: refuses, states "they make me sick" - Pneumovax: Not applicable - Prevnar: Not applicable - Zostavax vaccine: Given elsewhere, ordered script to pharmacy for patient to obtain  SCREENING: - Colonoscopy: Ordered today  Discussed with patient purpose of the colonoscopy is to detect colon cancer at curable precancerous or early stages    - AAA Screening: Not applicable  -Hearing Test: Not applicable  -Spirometry: Not applicable   PATIENT COUNSELING:    Sexuality: Discussed sexually transmitted diseases, partner selection, use of condoms, avoidance of unintended pregnancy  and contraceptive alternatives.   Advised to avoid cigarette smoking.  I discussed with the patient that most people either abstain from alcohol or drink within safe limits (<=14/week and <=4 drinks/occasion for males, <=7/weeks and <= 3 drinks/occasion for females) and that the risk for alcohol disorders and other health effects rises proportionally with the number of drinks per week and how often a drinker exceeds daily limits.  Discussed cessation/primary prevention of drug use and availability of treatment for abuse.   Diet: Encouraged to adjust caloric intake to maintain  or achieve ideal body weight, to reduce intake of dietary saturated fat and total fat, to limit sodium intake by avoiding high sodium foods and not adding table salt, and to maintain adequate dietary potassium and calcium preferably from fresh fruits, vegetables, and low-fat dairy products.    stressed the importance of regular exercise  Injury prevention: Discussed safety belts, safety helmets, smoke detector, smoking near bedding or upholstery.   Dental health: Discussed importance of regular tooth brushing, flossing, and dental visits.   Follow up plan: NEXT PREVENTATIVE PHYSICAL DUE IN 1 YEAR. Return in about 1 year (around 07/02/2019) for annual physical.

## 2018-07-02 LAB — LIPID PANEL W/O CHOL/HDL RATIO
Cholesterol, Total: 206 mg/dL — ABNORMAL HIGH (ref 100–199)
HDL: 38 mg/dL — ABNORMAL LOW (ref >39)
LDL CALC: 126 mg/dL — AB (ref 0–99)
Triglycerides: 211 mg/dL — ABNORMAL HIGH (ref 0–149)
VLDL Cholesterol Cal: 42 mg/dL — ABNORMAL HIGH (ref 5–40)

## 2018-07-02 LAB — PSA: PROSTATE SPECIFIC AG, SERUM: 0.4 ng/mL (ref 0.0–4.0)

## 2018-07-29 ENCOUNTER — Encounter: Payer: Self-pay | Admitting: *Deleted

## 2019-01-28 ENCOUNTER — Other Ambulatory Visit: Payer: Self-pay

## 2019-01-28 DIAGNOSIS — Z20822 Contact with and (suspected) exposure to covid-19: Secondary | ICD-10-CM

## 2019-01-30 LAB — NOVEL CORONAVIRUS, NAA: SARS-CoV-2, NAA: NOT DETECTED

## 2019-07-03 ENCOUNTER — Other Ambulatory Visit: Payer: Self-pay

## 2019-07-07 ENCOUNTER — Other Ambulatory Visit: Payer: Self-pay

## 2019-07-07 ENCOUNTER — Encounter: Payer: Self-pay | Admitting: Nurse Practitioner

## 2019-07-07 ENCOUNTER — Ambulatory Visit (INDEPENDENT_AMBULATORY_CARE_PROVIDER_SITE_OTHER): Payer: Self-pay | Admitting: Nurse Practitioner

## 2019-07-07 VITALS — BP 125/80 | HR 68 | Temp 98.6°F | Ht 74.5 in | Wt 254.8 lb

## 2019-07-07 DIAGNOSIS — E559 Vitamin D deficiency, unspecified: Secondary | ICD-10-CM

## 2019-07-07 DIAGNOSIS — E7800 Pure hypercholesterolemia, unspecified: Secondary | ICD-10-CM

## 2019-07-07 DIAGNOSIS — I10 Essential (primary) hypertension: Secondary | ICD-10-CM

## 2019-07-07 DIAGNOSIS — Z1211 Encounter for screening for malignant neoplasm of colon: Secondary | ICD-10-CM

## 2019-07-07 DIAGNOSIS — Z6832 Body mass index (BMI) 32.0-32.9, adult: Secondary | ICD-10-CM

## 2019-07-07 DIAGNOSIS — Z114 Encounter for screening for human immunodeficiency virus [HIV]: Secondary | ICD-10-CM

## 2019-07-07 DIAGNOSIS — Z125 Encounter for screening for malignant neoplasm of prostate: Secondary | ICD-10-CM

## 2019-07-07 NOTE — Assessment & Plan Note (Signed)
Check PSA today.  Discussed at length with patient and wishes lab work performed.

## 2019-07-07 NOTE — Patient Instructions (Signed)
Healthy Eating Following a healthy eating pattern may help you to achieve and maintain a healthy body weight, reduce the risk of chronic disease, and live a long and productive life. It is important to follow a healthy eating pattern at an appropriate calorie level for your body. Your nutritional needs should be met primarily through food by choosing a variety of nutrient-rich foods. What are tips for following this plan? Reading food labels  Read labels and choose the following: ? Reduced or low sodium. ? Juices with 100% fruit juice. ? Foods with low saturated fats and high polyunsaturated and monounsaturated fats. ? Foods with whole grains, such as whole wheat, cracked wheat, brown rice, and wild rice. ? Whole grains that are fortified with folic acid. This is recommended for women who are pregnant or who want to become pregnant.  Read labels and avoid the following: ? Foods with a lot of added sugars. These include foods that contain brown sugar, corn sweetener, corn syrup, dextrose, fructose, glucose, high-fructose corn syrup, honey, invert sugar, lactose, malt syrup, maltose, molasses, raw sugar, sucrose, trehalose, or turbinado sugar.  Do not eat more than the following amounts of added sugar per day:  6 teaspoons (25 g) for women.  9 teaspoons (38 g) for men. ? Foods that contain processed or refined starches and grains. ? Refined grain products, such as white flour, degermed cornmeal, white bread, and white rice. Shopping  Choose nutrient-rich snacks, such as vegetables, whole fruits, and nuts. Avoid high-calorie and high-sugar snacks, such as potato chips, fruit snacks, and candy.  Use oil-based dressings and spreads on foods instead of solid fats such as butter, stick margarine, or cream cheese.  Limit pre-made sauces, mixes, and "instant" products such as flavored rice, instant noodles, and ready-made pasta.  Try more plant-protein sources, such as tofu, tempeh, black beans,  edamame, lentils, nuts, and seeds.  Explore eating plans such as the Mediterranean diet or vegetarian diet. Cooking  Use oil to saut or stir-fry foods instead of solid fats such as butter, stick margarine, or lard.  Try baking, boiling, grilling, or broiling instead of frying.  Remove the fatty part of meats before cooking.  Steam vegetables in water or broth. Meal planning   At meals, imagine dividing your plate into fourths: ? One-half of your plate is fruits and vegetables. ? One-fourth of your plate is whole grains. ? One-fourth of your plate is protein, especially lean meats, poultry, eggs, tofu, beans, or nuts.  Include low-fat dairy as part of your daily diet. Lifestyle  Choose healthy options in all settings, including home, work, school, restaurants, or stores.  Prepare your food safely: ? Wash your hands after handling raw meats. ? Keep food preparation surfaces clean by regularly washing with hot, soapy water. ? Keep raw meats separate from ready-to-eat foods, such as fruits and vegetables. ? Cook seafood, meat, poultry, and eggs to the recommended internal temperature. ? Store foods at safe temperatures. In general:  Keep cold foods at 59F (4.4C) or below.  Keep hot foods at 159F (60C) or above.  Keep your freezer at [facility] (-17.8C) or below.  Foods are no longer safe to eat when they have been between the temperatures of 40-159F (4.4-60C) for more than 2 hours. What foods should I eat? Fruits Aim to eat 2 cup-equivalents of fresh, canned (in natural juice), or frozen fruits each day. Examples of 1 cup-equivalent of fruit include 1 small apple, 8 large strawberries, 1 cup canned fruit,  cup  dried fruit, or 1 cup 100% juice. Vegetables Aim to eat 2-3 cup-equivalents of fresh and frozen vegetables each day, including different varieties and colors. Examples of 1 cup-equivalent of vegetables include 2 medium carrots, 2 cups raw, leafy greens, 1 cup chopped  vegetable (raw or cooked), or 1 medium baked potato. Grains Aim to eat 6 ounce-equivalents of whole grains each day. Examples of 1 ounce-equivalent of grains include 1 slice of bread, 1 cup ready-to-eat cereal, 3 cups popcorn, or  cup cooked rice, pasta, or cereal. Meats and other proteins Aim to eat 5-6 ounce-equivalents of protein each day. Examples of 1 ounce-equivalent of protein include 1 egg, 1/2 cup nuts or seeds, or 1 tablespoon (16 g) peanut butter. A cut of meat or fish that is the size of a deck of cards is about 3-4 ounce-equivalents.  Of the protein you eat each week, try to have at least 8 ounces come from seafood. This includes salmon, trout, herring, and anchovies. Dairy Aim to eat 3 cup-equivalents of fat-free or low-fat dairy each day. Examples of 1 cup-equivalent of dairy include 1 cup (240 mL) milk, 8 ounces (250 g) yogurt, 1 ounces (44 g) natural cheese, or 1 cup (240 mL) fortified soy milk. Fats and oils  Aim for about 5 teaspoons (21 g) per day. Choose monounsaturated fats, such as canola and olive oils, avocados, peanut butter, and most nuts, or polyunsaturated fats, such as sunflower, corn, and soybean oils, walnuts, pine nuts, sesame seeds, sunflower seeds, and flaxseed. Beverages  Aim for six 8-oz glasses of water per day. Limit coffee to three to five 8-oz cups per day.  Limit caffeinated beverages that have added calories, such as soda and energy drinks.  Limit alcohol intake to no more than 1 drink a day for nonpregnant women and 2 drinks a day for men. One drink equals 12 oz of beer (355 mL), 5 oz of wine (148 mL), or 1 oz of hard liquor (44 mL). Seasoning and other foods  Avoid adding excess amounts of salt to your foods. Try flavoring foods with herbs and spices instead of salt.  Avoid adding sugar to foods.  Try using oil-based dressings, sauces, and spreads instead of solid fats. This information is based on general [place] nutrition guidelines. For more  information, visit choosemyplate.gov. Exact amounts may vary based on your nutrition needs. Summary  A healthy eating plan may help you to maintain a healthy weight, reduce the risk of chronic diseases, and stay active throughout your life.  Plan your meals. Make sure you eat the right portions of a variety of nutrient-rich foods.  Try baking, boiling, grilling, or broiling instead of frying.  Choose healthy options in all settings, including home, work, school, restaurants, or stores. This information is not intended to replace advice given to you by your health care provider. Make sure you discuss any questions you have with your health care provider. Document Released: 10/08/2017 Document Revised: 10/08/2017 Document Reviewed: 10/08/2017 Elsevier Patient Education  2020 [employer]  

## 2019-07-07 NOTE — Assessment & Plan Note (Signed)
Chronic, stable with BP below goal.  No current prescription medications.  Continue supplements and diet focus.  Monitor BP at home at least 3 days a week.  CMP today.

## 2019-07-07 NOTE — Assessment & Plan Note (Signed)
Continue daily supplement and check Vit D level today.

## 2019-07-07 NOTE — Progress Notes (Signed)
BP 125/80 (BP Location: Left Arm, Patient Position: Sitting, Cuff Size: Normal)   Pulse 68   Temp 98.6 F (37 C) (Oral)   Ht 6' 2.5" (1.892 m)   Wt 254 lb 12.8 oz (115.6 kg)   SpO2 95%   BMI 32.28 kg/m    Subjective:    Patient ID: Juan Russell, male    DOB: 1966-03-17, 53 y.o.   MRN: [id number]  HPI: Juan Russell is a 53 y.o. male presenting on 07/07/2019 for comprehensive medical examination. Current medical complaints include:none  He currently lives with: wife Interim Problems from his last visit: no   HYPERTENSION / HYPERLIPIDEMIA  Currently not taking any medications for BP or cholesterol. Does take supplements, garlic/cinnamon/vitamin D. Does have family history of father having stroke at around 36-61.  Satisfied with current treatment? yes  Duration of hypertension: chronic  BP monitoring frequency: not checking  BP range:  BP medication side effects: no  Duration of hyperlipidemia: chronic  Aspirin: no  Recent stressors: no  Recurrent headaches: no  Visual changes: no  Palpitations: no  Dyspnea: no  Chest pain: no  Lower extremity edema: no  Dizzy/lightheaded: no  The 10-year ASCVD risk score Denman George DC Jr., et al., 2013) is: 6.1%  Values used to calculate the score:  Age: 46 years  Sex: Male  Is Non-Hispanic African American: Yes  Diabetic: No  Tobacco smoker: No  Systolic Blood Pressure: 125 mmHg  Is BP treated: No  HDL Cholesterol: 38 mg/dL  Total Cholesterol: 621 mg/dL  Functional Status Survey: Is the patient deaf or have difficulty hearing?: No Does the patient have difficulty seeing, even when wearing glasses/contacts?: No Does the patient have difficulty concentrating, remembering, or making decisions?: No Does the patient have difficulty walking or climbing stairs?: No Does the patient have difficulty dressing or bathing?: No Does the patient have difficulty doing errands alone such as visiting a doctor's office or shopping?: No  FALL RISK:  Fall Risk  07/01/2018  Falls in the past year? 0  Number falls in past yr: 0  Injury with Fall? 0  Follow up Falls evaluation completed    Depression Screen Depression screen [facility] 2/9 07/07/2019 07/01/2018  Decreased Interest 0 0  Down, Depressed, Hopeless 0 0  PHQ - 2 Score 0 0  Altered sleeping 0 0  Tired, decreased energy 0 2  Change in appetite 0 0  Feeling bad or failure about yourself  0 0  Trouble concentrating 0 0  Moving slowly or fidgety/restless 0 0  Suicidal thoughts 0 0  PHQ-9 Score 0 2  Difficult doing work/chores Not difficult at all Not difficult at all    Advanced Directives <no information>  Past Medical History:  Past Medical History:  Diagnosis Date  . BPH (benign prostatic hyperplasia)     Surgical History:  Past Surgical History:  Procedure Laterality Date  . EYE SURGERY     as a child    Medications:  Current Outpatient Medications on File Prior to Visit  Medication Sig  . CALCIUM PO Take 600 mg by mouth every other day.  Marland Kitchen CINNAMON PO Take by mouth daily.  Marland Kitchen doxycycline (VIBRA-TABS) 100 MG tablet Take 1 tablet (100 mg total) by mouth 2 (two) times daily.  Marland Kitchen GARLIC PO Take by mouth daily.  Marland Kitchen VITAMIN D PO Take by mouth daily.   No current facility-administered medications on file prior to visit.    Allergies:  No Known Allergies  Social History:  Social History   Socioeconomic History  . Marital status: Single    Spouse name: Not on file  . Number of children: Not on file  . Years of education: Not on file  . Highest education level: Not on file  Occupational History  . Not on file  Tobacco Use  . Smoking status: Never Smoker  . Smokeless tobacco: Never Used  Substance and Sexual Activity  . Alcohol use: No    Comment: Quit a month ago  . Drug use: No  . Sexual activity: Not Currently  Other Topics Concern  . Not on file  Social History Narrative  . Not on file   Social Determinants of Health   Financial Resource  Strain:   . Difficulty of Paying Living Expenses: Not on file  Food Insecurity:   . Worried About Programme researcher, broadcasting/film/video in the Last Year: Not on file  . Ran Out of Food in the Last Year: Not on file  Transportation Needs:   . Lack of Transportation (Medical): Not on file  . Lack of Transportation (Non-Medical): Not on file  Physical Activity:   . Days of Exercise per Week: Not on file  . Minutes of Exercise per Session: Not on file  Stress:   . Feeling of Stress : Not on file  Social Connections:   . Frequency of Communication with Friends and Family: Not on file  . Frequency of Social Gatherings with Friends and Family: Not on file  . Attends Religious Services: Not on file  . Active Member of Clubs or Organizations: Not on file  . Attends Banker Meetings: Not on file  . Marital Status: Not on file  Intimate Partner Violence:   . Fear of Current or Ex-Partner: Not on file  . Emotionally Abused: Not on file  . Physically Abused: Not on file  . Sexually Abused: Not on file   Social History   Tobacco Use  Smoking Status Never Smoker  Smokeless Tobacco Never Used   Social History   Substance and Sexual Activity  Alcohol Use No   Comment: Quit a month ago    Family History:  Family History  Problem Relation Age of Onset  . Diabetes Mother   . Alcohol abuse Father   . Stroke Father   . Asthma Brother   . Hypertension Brother   . Cirrhosis Brother     Past medical history, surgical history, medications, allergies, family history and social history reviewed with patient today and changes made to appropriate areas of the chart.   Review of Systems - negative All other ROS negative except what is listed above and in the HPI.      Objective:    BP 125/80 (BP Location: Left Arm, Patient Position: Sitting, Cuff Size: Normal)   Pulse 68   Temp 98.6 F (37 C) (Oral)   Ht 6' 2.5" (1.892 m)   Wt 254 lb 12.8 oz (115.6 kg)   SpO2 95%   BMI 32.28 kg/m   Wt  Readings from Last 3 Encounters:  07/07/19 254 lb 12.8 oz (115.6 kg)  07/01/18 250 lb (113.4 kg)  06/27/17 254 lb 4.8 oz (115.3 kg)    Physical Exam Vitals and nursing note reviewed.  Constitutional:      General: He is awake. He is not in acute distress.    Appearance: He is well-developed. He is obese. He is not ill-appearing.  HENT:     Head: Normocephalic  and atraumatic.     Right Ear: Hearing, tympanic membrane, ear canal and external ear normal. No decreased hearing noted. No drainage.     Left Ear: Hearing, tympanic membrane, ear canal and external ear normal. No decreased hearing noted. No drainage.     Nose: Nose normal.     Right Sinus: No maxillary sinus tenderness or frontal sinus tenderness.     Left Sinus: No maxillary sinus tenderness or frontal sinus tenderness.     Mouth/Throat:     Pharynx: Uvula midline.  Eyes:     General: Lids are normal.        Right eye: No discharge.        Left eye: No discharge.     Extraocular Movements: Extraocular movements intact.     Conjunctiva/sclera: Conjunctivae normal.     Pupils: Pupils are equal, round, and reactive to light.     Visual Fields: Right eye visual fields normal and left eye visual fields normal.  Neck:     Thyroid: No thyromegaly.     Vascular: No carotid bruit.  Cardiovascular:     Rate and Rhythm: Normal rate and regular rhythm.     Heart sounds: Normal heart sounds, S1 normal and S2 normal. No murmur. No gallop.   Pulmonary:     Effort: Pulmonary effort is normal. No accessory muscle usage or respiratory distress.     Breath sounds: Normal breath sounds.  Abdominal:     General: Bowel sounds are normal.     Palpations: Abdomen is soft. There is no hepatomegaly or splenomegaly.     Tenderness: There is no abdominal tenderness.     Hernia: No hernia is present.  Genitourinary:    Penis: Normal.      Rectum: Normal.     Comments: Deferred per patient request Musculoskeletal:        General: Normal range  of motion.     Cervical back: Normal range of motion and neck supple.     Right lower leg: No edema.     Left lower leg: No edema.  Lymphadenopathy:     Head:     Right side of head: No submental, submandibular, tonsillar, preauricular or posterior auricular adenopathy.     Left side of head: No submental, submandibular, tonsillar, preauricular or posterior auricular adenopathy.     Cervical: No cervical adenopathy.  Skin:    General: Skin is warm and dry.     Capillary Refill: Capillary refill takes less than 2 seconds.     Findings: No rash.  Neurological:     Mental Status: He is alert and oriented to person, place, and time.     Cranial Nerves: Cranial nerves are intact.     Coordination: Coordination is intact.     Gait: Gait is intact.     Deep Tendon Reflexes: Reflexes are normal and symmetric.     Reflex Scores:      Brachioradialis reflexes are 2+ on the right side and 2+ on the left side.      Patellar reflexes are 2+ on the right side and 2+ on the left side. Psychiatric:        Attention and Perception: Attention normal.        Mood and Affect: Mood normal.        Speech: Speech normal.        Behavior: Behavior normal. Behavior is cooperative.        Thought Content: Thought content normal.  Judgment: Judgment normal.    Results for orders placed or performed in visit on 01/28/19  Novel Coronavirus, NAA ([facility])  Result Value Ref Range   SARS-CoV-2, NAA Not Detected Not Detected      Assessment & Plan:   Problem List Items Addressed This Visit      Cardiovascular and Mediastinum   Hypertension    Chronic, stable with BP below goal.  No current prescription medications.  Continue supplements and diet focus.  Monitor BP at home at least 3 days a week.  CMP today.      Relevant Orders   CBC with Differential/Platelet out   Comprehensive metabolic panel   TSH     Other   Prostate cancer screening    Check PSA today.  Discussed at length with  patient and wishes lab work performed.      Relevant Orders   PSA   Hypercholesteremia    Chronic, stable without medication.  ASCVD 6.1% based on recent labs.  Continue supplement and diet focus.  CMP and lipid panel today.      Relevant Orders   Comprehensive metabolic panel   Lipid Panel w/o Chol/HDL Ratio out   BMI 32.0-32.9,adult    Recommend continued focus on healthy diet choices and regular physical activity (30 minutes 5 days a week).       Vitamin D deficiency    Continue daily supplement and check Vit D level today.      Relevant Orders   Vit D  25 hydroxy (rtn osteoporosis monitoring)    Other Visit Diagnoses    Screening for HIV (human immunodeficiency virus)    -  Primary   Relevant Orders   HIV   Colon cancer screening       Referral to GI placed.   Relevant Orders   Ambulatory referral to Gastroenterology       Discussed aspirin prophylaxis for myocardial infarction prevention and decision was it was not indicated  LABORATORY TESTING:  Health maintenance labs ordered today as discussed above.   The natural history of prostate cancer and ongoing controversy regarding screening and potential treatment outcomes of prostate cancer has been discussed with the patient. The meaning of a false positive PSA and a false negative PSA has been discussed. He indicates understanding of the limitations of this screening test and wishes to proceed with screening PSA testing.   IMMUNIZATIONS:   - Tdap: Tetanus vaccination status reviewed: last tetanus booster within 10 years. - Influenza: Refused - Pneumovax: Not applicable - Prevnar: Not applicable - Zostavax vaccine: Refused  SCREENING: - Colonoscopy: Ordered today  Discussed with patient purpose of the colonoscopy is to detect colon cancer at curable precancerous or early stages   - AAA Screening: Not applicable  -Hearing Test: Not applicable  -Spirometry: Not applicable   PATIENT COUNSELING:     Sexuality: Discussed sexually transmitted diseases, partner selection, use of condoms, avoidance of unintended pregnancy  and contraceptive alternatives.   Advised to avoid cigarette smoking.  I discussed with the patient that most people either abstain from alcohol or drink within safe limits (<=14/week and <=4 drinks/occasion for males, <=7/weeks and <= 3 drinks/occasion for females) and that the risk for alcohol disorders and other health effects rises proportionally with the number of drinks per week and how often a drinker exceeds daily limits.  Discussed cessation/primary prevention of drug use and availability of treatment for abuse.   Diet: Encouraged to adjust caloric intake to maintain  or achieve  ideal body weight, to reduce intake of dietary saturated fat and total fat, to limit sodium intake by avoiding high sodium foods and not adding table salt, and to maintain adequate dietary potassium and calcium preferably from fresh fruits, vegetables, and low-fat dairy products.    stressed the importance of regular exercise  Injury prevention: Discussed safety belts, safety helmets, smoke detector, smoking near bedding or upholstery.   Dental health: Discussed importance of regular tooth brushing, flossing, and dental visits.   Follow up plan: NEXT PREVENTATIVE PHYSICAL DUE IN 1 YEAR. Return in about 1 year (around 07/06/2020) for Annual physical.

## 2019-07-07 NOTE — Assessment & Plan Note (Signed)
Chronic, stable without medication.  ASCVD 6.1% based on recent labs.  Continue supplement and diet focus.  CMP and lipid panel today.

## 2019-07-07 NOTE — Assessment & Plan Note (Signed)
Recommend continued focus on healthy diet choices and regular physical activity (30 minutes 5 days a week).  

## 2019-07-08 ENCOUNTER — Other Ambulatory Visit: Payer: Self-pay | Admitting: Nurse Practitioner

## 2019-07-08 DIAGNOSIS — E559 Vitamin D deficiency, unspecified: Secondary | ICD-10-CM

## 2019-07-08 LAB — CBC WITH DIFFERENTIAL/PLATELET
Basophils Absolute: 0 10*3/uL (ref 0.0–0.2)
Basos: 0 %
EOS (ABSOLUTE): 0.1 10*3/uL (ref 0.0–0.4)
Eos: 3 %
Hematocrit: 43.3 % (ref 37.5–51.0)
Hemoglobin: 14.7 g/dL (ref 13.0–17.7)
Immature Grans (Abs): 0 10*3/uL (ref 0.0–0.1)
Immature Granulocytes: 0 %
Lymphocytes Absolute: 1.6 10*3/uL (ref 0.7–3.1)
Lymphs: 38 %
MCH: 28.9 pg (ref 26.6–33.0)
MCHC: 33.9 g/dL (ref 31.5–35.7)
MCV: 85 fL (ref 79–97)
Monocytes Absolute: 0.3 10*3/uL (ref 0.1–0.9)
Monocytes: 8 %
Neutrophils Absolute: 2.1 10*3/uL (ref 1.4–7.0)
Neutrophils: 51 %
Platelets: 253 10*3/uL (ref 150–450)
RBC: 5.09 x10E6/uL (ref 4.14–5.80)
RDW: 13.3 % (ref 11.6–15.4)
WBC: 4.2 10*3/uL (ref 3.4–10.8)

## 2019-07-08 LAB — COMPREHENSIVE METABOLIC PANEL
ALT: 10 IU/L (ref 0–44)
AST: 12 IU/L (ref 0–40)
Albumin/Globulin Ratio: 1.5 (ref 1.2–2.2)
Albumin: 3.9 g/dL (ref 3.8–4.9)
Alkaline Phosphatase: 105 IU/L (ref 39–117)
BUN/Creatinine Ratio: 13 (ref 9–20)
BUN: 17 mg/dL (ref 6–24)
Bilirubin Total: 0.3 mg/dL (ref 0.0–1.2)
CO2: 21 mmol/L (ref 20–29)
Calcium: 9 mg/dL (ref 8.7–10.2)
Chloride: 106 mmol/L (ref 96–106)
Creatinine, Ser: 1.29 mg/dL — ABNORMAL HIGH (ref 0.76–1.27)
GFR calc Af Amer: 73 mL/min/{1.73_m2} (ref >59)
GFR calc non Af Amer: 63 mL/min/{1.73_m2} (ref >59)
Globulin, Total: 2.6 g/dL (ref 1.5–4.5)
Glucose: 113 mg/dL — ABNORMAL HIGH (ref 65–99)
Potassium: 4.1 mmol/L (ref 3.5–5.2)
Sodium: 142 mmol/L (ref 134–144)
Total Protein: 6.5 g/dL (ref 6.0–8.5)

## 2019-07-08 LAB — VITAMIN D 25 HYDROXY (VIT D DEFICIENCY, FRACTURES): Vit D, 25-Hydroxy: 12.8 ng/mL — ABNORMAL LOW (ref 30.0–100.0)

## 2019-07-08 LAB — LIPID PANEL W/O CHOL/HDL RATIO
Cholesterol, Total: 226 mg/dL — ABNORMAL HIGH (ref 100–199)
HDL: 41 mg/dL (ref >39)
LDL Chol Calc (NIH): 135 mg/dL — ABNORMAL HIGH (ref 0–99)
Triglycerides: 278 mg/dL — ABNORMAL HIGH (ref 0–149)
VLDL Cholesterol Cal: 50 mg/dL — ABNORMAL HIGH (ref 5–40)

## 2019-07-08 LAB — TSH: TSH: 1.6 u[IU]/mL (ref 0.450–4.500)

## 2019-07-08 LAB — HIV ANTIBODY (ROUTINE TESTING W REFLEX): HIV Screen 4th Generation wRfx: NONREACTIVE

## 2019-07-08 LAB — PSA: Prostate Specific Ag, Serum: 0.4 ng/mL (ref 0.0–4.0)

## 2019-07-08 MED ORDER — CHOLECALCIFEROL 1.25 MG (50000 UT) PO TABS: 1.0000 | ORAL_TABLET | ORAL | 0 refills | Status: DC

## 2019-07-08 NOTE — Progress Notes (Signed)
I spoke to patient via telephone about labs.  Am sending him in a higher dose Vitamin D into pharmacy for him, but he needs lab visit scheduled for 8 weeks please to recheck.  Thank you.

## 2019-07-08 NOTE — Progress Notes (Signed)
Vitamin D 50,000 units weekly ordered

## 2019-07-24 ENCOUNTER — Encounter: Payer: Self-pay | Admitting: *Deleted

## 2019-09-01 ENCOUNTER — Other Ambulatory Visit: Payer: Self-pay

## 2020-12-16 ENCOUNTER — Other Ambulatory Visit: Payer: Self-pay

## 2020-12-16 DIAGNOSIS — I10 Essential (primary) hypertension: Secondary | ICD-10-CM | POA: Insufficient documentation

## 2020-12-16 DIAGNOSIS — R42 Dizziness and giddiness: Secondary | ICD-10-CM | POA: Insufficient documentation

## 2020-12-16 LAB — CBC
HCT: 42.8 % (ref 39.0–52.0)
Hemoglobin: 14.6 g/dL (ref 13.0–17.0)
MCH: 29.6 pg (ref 26.0–34.0)
MCHC: 34.1 g/dL (ref 30.0–36.0)
MCV: 86.8 fL (ref 80.0–100.0)
Platelets: 251 10*3/uL (ref 150–400)
RBC: 4.93 MIL/uL (ref 4.22–5.81)
RDW: 13.4 % (ref 11.5–15.5)
WBC: 5 10*3/uL (ref 4.0–10.5)
nRBC: 0 % (ref 0.0–0.2)

## 2020-12-16 NOTE — ED Triage Notes (Signed)
Pt in with co headache and dizziness x 1 week, denies any recent illness. Denies any cp or shob.

## 2020-12-17 ENCOUNTER — Emergency Department: Payer: Self-pay

## 2020-12-17 ENCOUNTER — Emergency Department
Admission: EM | Admit: 2020-12-17 | Discharge: 2020-12-17 | Disposition: A | Discharge status: Home / Self Care | Payer: Self-pay | Attending: Emergency Medicine | Admitting: Emergency Medicine

## 2020-12-17 DIAGNOSIS — R42 Dizziness and giddiness: Secondary | ICD-10-CM

## 2020-12-17 DIAGNOSIS — I10 Essential (primary) hypertension: Secondary | ICD-10-CM

## 2020-12-17 LAB — COMPREHENSIVE METABOLIC PANEL
ALT: 21 U/L (ref 0–44)
AST: 27 U/L (ref 15–41)
Albumin: 3.9 g/dL (ref 3.5–5.0)
Alkaline Phosphatase: 81 U/L (ref 38–126)
Anion gap: 8 (ref 5–15)
BUN: 19 mg/dL (ref 6–20)
CO2: 23 mmol/L (ref 22–32)
Calcium: 8.8 mg/dL — ABNORMAL LOW (ref 8.9–10.3)
Chloride: 106 mmol/L (ref 98–111)
Creatinine, Ser: 1.28 mg/dL — ABNORMAL HIGH (ref 0.61–1.24)
GFR, Estimated: >60 mL/min (ref >60)
Glucose, Bld: 141 mg/dL — ABNORMAL HIGH (ref 70–99)
Potassium: 3.7 mmol/L (ref 3.5–5.1)
Sodium: 137 mmol/L (ref 135–145)
Total Bilirubin: 0.7 mg/dL (ref 0.3–1.2)
Total Protein: 7.1 g/dL (ref 6.5–8.1)

## 2020-12-17 LAB — URINALYSIS, COMPLETE (UACMP) WITH MICROSCOPIC
Bacteria, UA: NONE SEEN
Bilirubin Urine: NEGATIVE
Glucose, UA: NEGATIVE mg/dL
Hgb urine dipstick: NEGATIVE
Ketones, ur: NEGATIVE mg/dL
Leukocytes,Ua: NEGATIVE
Nitrite: NEGATIVE
Protein, ur: NEGATIVE mg/dL
Specific Gravity, Urine: 1.017 (ref 1.005–1.030)
pH: 5 (ref 5.0–8.0)

## 2020-12-17 LAB — TROPONIN I (HIGH SENSITIVITY)
Troponin I (High Sensitivity): 4 ng/L (ref <18)
Troponin I (High Sensitivity): 3 ng/L (ref <18)

## 2020-12-17 NOTE — ED Notes (Signed)
Pt given phone for MRI screening at this time.  

## 2020-12-17 NOTE — ED Provider Notes (Signed)
I assumed care of this patient approximately 0 700.  Please have conferred significant full details regarding patient's initial evaluation assessment.  In brief patient presents for assessment of headache and dizziness for about a week.  No focal deficits.  He declined any analgesia.  He has a nonfocal neuro exam.  He is pending MRI brain at time of signout.  MRI brain remarkable for little bit of dilation of the perivascular spaces in the right parietal brain of unclear allergy.  No evidence of CVA, hydrocephalus, extra-axial collection and major vessels show flow.  Advised patient of this finding and recommendation for close follow-up in 3 months as well as following up with PCP in next couple days.  He voiced understanding agreement with plan.  Discharged in stable condition.   Gilles Chiquito, MD 12/17/20 1019

## 2020-12-17 NOTE — ED Provider Notes (Signed)
[facility] Emergency Department Provider Note   ____________________________________________   Event Date/Time   First MD Initiated Contact with Patient 12/17/20 [phone]     (approximate)  I have reviewed the triage vital signs and the nursing notes.   HISTORY  Chief Complaint Dizziness    HPI Juan Russell is a 55 y.o. male who presents to the ED from home with a chief complaint of headache and dizziness.  Patient reports a 1 week history of dull, posterior headache.  Reports feeling lightheaded and like he is leaning to 1 side.  Denies facial droop, slurred speech, altered mentation, extremity weakness/numbness/tingling.  Denies fever, cough, chest pain, shortness of breath, abdominal pain, nausea, vomiting or diarrhea.  Denies recent travel or trauma.  Does not take antihypertensives.     Past Medical History:  Diagnosis Date   BPH (benign prostatic hyperplasia)     Patient Active Problem List   Diagnosis Date Noted   Vitamin D deficiency 07/07/2019   BMI 32.0-32.9,adult 07/01/2018   Prostate cancer screening 06/27/2017   Hypercholesteremia 06/27/2017   Hypertension 06/27/2017    Past Surgical History:  Procedure Laterality Date   EYE SURGERY     as a child    Prior to Admission medications   Medication Sig Start Date End Date Taking? Authorizing Provider  CALCIUM PO Take 600 mg by mouth every other day.    [provider]  Cholecalciferol 1.25 MG (50000 UT) TABS Take 1 tablet by mouth once a week. For 8 weeks and then stop.  Return to office for lab draw. 07/08/19   Cannady, Corrie Dandy T, NP  CINNAMON PO Take by mouth daily.    [provider]  doxycycline (VIBRA-TABS) 100 MG tablet Take 1 tablet (100 mg total) by mouth 2 (two) times daily. 07/01/18   Cannady, Corrie Dandy T, NP  GARLIC PO Take by mouth daily.    [provider]  VITAMIN D PO Take by mouth daily.    [provider]    Allergies Patient has no  known allergies.  Family History  Problem Relation Age of Onset   Diabetes Mother    Alcohol abuse Father    Stroke Father    Asthma Brother    Hypertension Brother    Cirrhosis Brother     Social History Social History   Tobacco Use   Smoking status: Never   Smokeless tobacco: Never  Vaping Use   Vaping Use: Never used  Substance Use Topics   Alcohol use: No    Comment: Quit a month ago   Drug use: No    Review of Systems  Constitutional: No fever/chills Eyes: No visual changes. ENT: No sore throat. Cardiovascular: Denies chest pain. Respiratory: Denies shortness of breath. Gastrointestinal: No abdominal pain.  No nausea, no vomiting.  No diarrhea.  No constipation. Genitourinary: Negative for dysuria. Musculoskeletal: Negative for back pain. Skin: Negative for rash. Neurological: Positive for headache, lightheadedness and feeling of leaning to one side.  No focal weakness or numbness.   ____________________________________________   PHYSICAL EXAM:  VITAL SIGNS: ED Triage Vitals  Enc Vitals Group     BP 12/16/20 2322 (!) 145/91     Pulse Rate 12/16/20 2322 79     Resp 12/16/20 2322 20     Temp 12/16/20 2322 98.6 F (37 C)     Temp Source 12/16/20 2322 Oral     SpO2 12/16/20 2322 95 %     Weight 12/16/20 2321  240 lb (108.9 kg)     Height 12/16/20 2321 6\' 2"  (1.88 m)     Head Circumference --      Peak Flow --      Pain Score 12/16/20 2320 5     Pain Loc --      Pain Edu? --      Excl. in GC? --     Constitutional: Alert and oriented. Well appearing and in no acute distress. Eyes: Conjunctivae are normal. PERRL. EOMI. Head: Atraumatic. Nose: No congestion/rhinnorhea. Mouth/Throat: Mucous membranes are moist.   Neck: No stridor.  No carotid bruits.  Supple neck without meningismus. Cardiovascular: Normal rate, regular rhythm. Grossly normal heart sounds.  Good peripheral circulation. Respiratory: Normal respiratory effort.  No retractions. Lungs  CTAB. Gastrointestinal: Soft and nontender. No distention. No abdominal bruits. No CVA tenderness. Musculoskeletal: No lower extremity tenderness nor edema.  No joint effusions. Neurologic: Alert and oriented x3.  CN II-XII grossly intact.  Normal speech and language. No gross focal neurologic deficits are appreciated. MAEx4. Skin:  Skin is warm, dry and intact. No rash noted.  No petechiae. Psychiatric: Mood and affect are normal. Speech and behavior are normal.  ____________________________________________   LABS (all labs ordered are listed, but only abnormal results are displayed)  Labs Reviewed  COMPREHENSIVE METABOLIC PANEL - Abnormal; Notable for the following components:      Result Value   Glucose, Bld 141 (*)    Creatinine, Ser 1.28 (*)    Calcium 8.8 (*)    All other components within normal limits  CBC  URINALYSIS, COMPLETE (UACMP) WITH MICROSCOPIC  TROPONIN I (HIGH SENSITIVITY)  TROPONIN I (HIGH SENSITIVITY)   ____________________________________________  EKG  ED ECG REPORT I, Brantly Kalman J, the attending physician, personally viewed and interpreted this ECG.   Date: 12/17/2020  EKG Time: 2329  Rate: 80  Rhythm: normal EKG, normal sinus rhythm  Axis: Normal  Intervals:none  ST&T Change: Nonspecific  ____________________________________________  RADIOLOGY I, Jahzara Slattery J, personally viewed and evaluated these images (plain radiographs) as part of my medical decision making, as well as reviewing the written report by the radiologist.  ED MD interpretation: MRI pending  Official radiology report(s): No results found.  ____________________________________________   PROCEDURES  Procedure(s) performed (including Critical Care):  .1-3 Lead EKG Interpretation  Date/Time: 12/17/2020 6:30 AM Performed by: 02/16/2021, MD Authorized by: Irean Hong, MD     Interpretation: normal     ECG rate:  80   ECG rate assessment: normal     Rhythm: sinus rhythm      Ectopy: none     Conduction: normal   Comments:     Patient placed on cardiac monitor to evaluate for arrhythmias    NIH Stroke Scale  Interval: Baseline Time: 7:02 AM Person Administering Scale: Sisto Granillo J  Administer stroke scale items in the order listed. Record performance in each category after each subscale exam. Do not go back and change scores. Follow directions provided for each exam technique. Scores should reflect what the patient does, not what the clinician thinks the patient can do. The clinician should record answers while administering the exam and work quickly. Except where indicated, the patient should not be coached (i.e., repeated requests to patient to make a special effort).   1a  Level of consciousness: 0=alert; keenly responsive  1b. LOC questions:  0=Performs both tasks correctly  1c. LOC commands: 0=Performs both tasks correctly  2.  Best Gaze: 0=normal  3.  Visual: 0=No visual loss  4. Facial Palsy: 0=Normal symmetric movement  5a.  Motor left arm: 0=No drift, limb holds 90 (or 45) degrees for full 10 seconds  5b.  Motor right arm: 0=No drift, limb holds 90 (or 45) degrees for full 10 seconds  6a. motor left leg: 0=No drift, limb holds 90 (or 45) degrees for full 10 seconds  6b  Motor right leg:  0=No drift, limb holds 90 (or 45) degrees for full 10 seconds  7. Limb Ataxia: 0=Absent  8.  Sensory: 0=Normal; no sensory loss  9. Best Language:  0=No aphasia, normal  10. Dysarthria: 0=Normal  11. Extinction and Inattention: 0=No abnormality  12. Distal motor function: 0=Normal   Total:   0    ____________________________________________   INITIAL IMPRESSION / ASSESSMENT AND PLAN / ED COURSE  As part of my medical decision making, I reviewed the following data within the electronic MEDICAL RECORD NUMBER Nursing notes reviewed and incorporated, Labs reviewed, EKG interpreted, Old chart reviewed, and Notes from prior ED visits     55 year old male  presenting with a 1 week history of posterior headache, lightheadedness, dizziness and feeling of leaning towards 1 side. Differential diagnosis includes, but is not limited to, intracranial hemorrhage, meningitis/encephalitis, previous head trauma, cavernous venous thrombosis, tension headache, temporal arteritis, migraine or migraine equivalent, idiopathic intracranial hypertension, and non-specific headache.   Patient is hypertensive.  Laboratory results unremarkable with 2 sets of negative troponins.  Will check UA, obtain orthostatic vital signs and MRI brain.    Clinical Course as of 12/17/20 6948  Fri Dec 17, 2020  0701 Care transferred at change of shift to Dr. Katrinka Blazing pending UA and MRI brain.  If unremarkable, anticipate patient may be discharged home. [JS]    Clinical Course User Index [JS] Irean Hong, MD     ____________________________________________   FINAL CLINICAL IMPRESSION(S) / ED DIAGNOSES  Final diagnoses:  Dizziness  Hypertension, unspecified type     ED Discharge Orders     None        Note:  This document was prepared using Dragon voice recognition software and may include unintentional dictation errors.    Irean Hong, MD 12/17/20 [phone]

## 2020-12-17 NOTE — Discharge Instructions (Addendum)
Your MRI Showed: No acute finding. 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 Because of the somewhat unusual distribution, a follow-up study in 3 months with and without contrast is recommended to show stability and give [place] further confidence that these are not clinically important.

## 2020-12-17 NOTE — ED Notes (Signed)
Pt given specimen cup for urine sample at this time

## 2020-12-17 NOTE — ED Notes (Signed)
Pt verbalized understanding of d/c instructions at this time. Follow-up care reviewed. Pt ambulatory to ED lobby, NAD noted, RR even and unlabored at this time.

## 2020-12-17 NOTE — ED Notes (Signed)
Pt in MRI. Will reassess VS upon pt return

## 2022-02-21 DIAGNOSIS — I2699 Other pulmonary embolism without acute cor pulmonale: Secondary | ICD-10-CM | POA: Diagnosis not present

## 2022-02-21 DIAGNOSIS — Z006 Encounter for examination for normal comparison and control in clinical research program: Secondary | ICD-10-CM | POA: Diagnosis not present

## 2022-02-21 DIAGNOSIS — I2609 Other pulmonary embolism with acute cor pulmonale: Secondary | ICD-10-CM | POA: Diagnosis not present

## 2022-02-21 DIAGNOSIS — Z6832 Body mass index (BMI) 32.0-32.9, adult: Secondary | ICD-10-CM | POA: Diagnosis not present

## 2022-02-21 DIAGNOSIS — N400 Enlarged prostate without lower urinary tract symptoms: Secondary | ICD-10-CM | POA: Diagnosis not present

## 2022-02-21 DIAGNOSIS — R0600 Dyspnea, unspecified: Secondary | ICD-10-CM | POA: Diagnosis not present

## 2022-02-21 DIAGNOSIS — R0602 Shortness of breath: Secondary | ICD-10-CM | POA: Diagnosis not present

## 2022-02-21 DIAGNOSIS — E876 Hypokalemia: Secondary | ICD-10-CM | POA: Diagnosis not present

## 2022-02-21 DIAGNOSIS — R7303 Prediabetes: Secondary | ICD-10-CM | POA: Diagnosis not present

## 2022-02-21 DIAGNOSIS — R0601 Orthopnea: Secondary | ICD-10-CM | POA: Diagnosis not present

## 2022-02-21 DIAGNOSIS — E785 Hyperlipidemia, unspecified: Secondary | ICD-10-CM | POA: Diagnosis not present

## 2022-02-21 DIAGNOSIS — I10 Essential (primary) hypertension: Secondary | ICD-10-CM | POA: Diagnosis not present

## 2022-02-21 DIAGNOSIS — R600 Localized edema: Secondary | ICD-10-CM | POA: Diagnosis not present

## 2022-02-23 DIAGNOSIS — I2609 Other pulmonary embolism with acute cor pulmonale: Secondary | ICD-10-CM | POA: Insufficient documentation

## 2022-02-23 DIAGNOSIS — I2782 Chronic pulmonary embolism: Secondary | ICD-10-CM | POA: Insufficient documentation

## 2022-02-24 DIAGNOSIS — R7303 Prediabetes: Secondary | ICD-10-CM | POA: Insufficient documentation

## 2022-03-14 ENCOUNTER — Inpatient Hospital Stay: Payer: Self-pay | Admitting: Nurse Practitioner

## 2022-03-14 DIAGNOSIS — I2609 Other pulmonary embolism with acute cor pulmonale: Secondary | ICD-10-CM

## 2022-03-14 DIAGNOSIS — N400 Enlarged prostate without lower urinary tract symptoms: Secondary | ICD-10-CM

## 2022-03-14 DIAGNOSIS — E559 Vitamin D deficiency, unspecified: Secondary | ICD-10-CM

## 2022-03-14 DIAGNOSIS — E7800 Pure hypercholesterolemia, unspecified: Secondary | ICD-10-CM

## 2022-03-14 DIAGNOSIS — I10 Essential (primary) hypertension: Secondary | ICD-10-CM

## 2022-03-19 NOTE — Patient Instructions (Signed)
Pulmonary Embolism  A pulmonary embolism (PE) is a sudden blockage or decrease of blood flow in one or both lungs that happens when a clot travels into the arteries of the lung (pulmonary arteries). Most blockages come from a blood clot that forms in the vein of a leg or arm (deep vein thrombosis, DVT) and travels to the lungs. A clot is blood that has thickened into a gel or solid. PE is a dangerous and life-threatening condition that needs to be treated right away. What are the causes? This condition is usually caused by a blood clot that forms in a vein and moves to the lungs. In rare cases, it may be caused by air, fat, part of a tumor, or other tissue that moves through the veins and into the lungs. What increases the risk? The following factors may make you more likely to develop this condition: Experiencing a traumatic injury, such as breaking a hip or leg. Having: A spinal cord injury. Major surgery, especially hip or knee replacement, or surgery on parts of the nervous system or on the abdomen. A stroke. A blood-clotting disease. Long-term (chronic) lung or heart disease. Cancer, especially if you are being treated with chemotherapy. A central venous catheter. Taking medicines that contain estrogen. These include birth control pills and hormone replacement therapy. Being: Pregnant. In the period of time after your baby is delivered (postpartum). Older than age 60. Overweight. A smoker, especially if you have other risks. Not very active (sedentary), not being able to move at all, or spending long periods sitting, such as travel over 6 hours. You are also at a greater risk if you have a leg in a cast or splint. What are the signs or symptoms? Symptoms of this condition usually start suddenly and include: Shortness of breath during activity or at rest. Coughing, coughing up blood, or coughing up bloody mucus. Chest pain, back pain, or shoulder blade pain that gets worse with deep  breaths. Rapid or irregular heartbeat. Feeling light-headed or dizzy, or fainting. Feeling anxious. Pain and swelling in a leg. This is a symptom of DVT, which can lead to PE. How is this diagnosed? This condition may be diagnosed based on your medical history, a physical exam, and tests. Tests may include: Blood tests. An ECG (electrocardiogram) of the heart. A CT pulmonary angiogram. This test checks blood flow in and around your lungs. A ventilation-perfusion scan, also called a lung VQ scan. This test measures air flow and blood flow to the lungs. An ultrasound to check for a DVT. How is this treated? Treatment for this condition depends on many factors, such as the cause of your PE, your risk for bleeding or developing more clots, and other medical conditions you may have. Treatment aims to stop blood clots from forming or growing larger. In some cases, treatment may be aimed at breaking apart or removing the blood clot. Treatment may include: Medicines, such as: Blood thinning medicines, also called anticoagulants, to stop clots from forming and growing. Medicines that break apart clots (fibrinolytics). Procedures, such as: Using a flexible tube to remove a blood clot (embolectomy) or to deliver medicine to destroy it (catheter-directed thrombolysis). Surgery to remove the clot (surgical embolectomy). This is rare. You may need a combination of immediate, long-term, and extended treatments. Your treatment may continue for several months (maintenance therapy) or longer depending on your medical conditions. You and your health care provider will work together to choose the treatment program that is best for you.   Follow these instructions at home: Medicines Take over-the-counter and prescription medicines only as told by your health care provider. If you are taking blood thinners: Talk with your health care provider before you take any medicines that contain aspirin or NSAIDs, such as  ibuprofen. These medicines increase your risk for dangerous bleeding. Take your medicine exactly as told, at the same time every day. Avoid activities that could cause injury or bruising, and follow instructions about how to prevent falls. Wear a medical alert bracelet or carry a card that lists what medicines you take. Understand what foods and drugs interact with any medicines that you are taking. General instructions Ask your health care provider when you may return to your normal activities. Avoid sitting or lying for a long time without moving. Maintain a healthy weight. Ask your health care provider what weight is healthy for you. Do not use any products that contain nicotine or tobacco. These products include cigarettes, chewing tobacco, and vaping devices, such as e-cigarettes. If you need help quitting, ask your health care provider. Talk with your health care provider about any travel plans. It is important to make sure that you are still able to take your medicine while traveling. Keep all follow-up visits. This is important. Where to find more information American Lung Association: www.lung.org Centers for Disease Control and Prevention: www.cdc.gov Contact a health care provider if: You missed a dose of your blood thinner medicine. You have a fever. Get help right away if: You have: New or increased pain, swelling, warmth, or redness in an arm or leg. Shortness of breath that gets worse during activity or at rest. Worsening chest pain. A rapid or irregular heartbeat. A severe headache. Vision changes. A serious fall or accident, or you hit your head. Blood in your vomit, stool, or urine. A cut that will not stop bleeding. You cough up blood. You feel light-headed or dizzy, and that feeling does not go away. You cannot move your arms or legs. You are confused or have memory loss. These symptoms may represent a serious problem that is an emergency. Do not wait to see if the  symptoms will go away. Get medical help right away. Call your local emergency services (911 in the [place] Do not drive yourself to the hospital. Summary A pulmonary embolism (PE) is a serious and potentially life-threatening condition. It happens when a blood clot from one part of the body travels to the arteries of the lung, causing a sudden blockage or decrease of blood flow to the lungs. This may result in shortness of breath, chest pain, dizziness, and fainting. Treatments for this condition usually include medicines to thin your blood (anticoagulants) or medicines to break apart blood clots. If you are given blood thinners, take your medicine exactly as told by your health care provider, at the same time every day. This is important. Understand what foods and drugs interact with any medicines that you are taking. If you have signs of PE or DVT, call your local emergency services (911 in the [place] This information is not intended to replace advice given to you by your health care provider. Make sure you discuss any questions you have with your health care provider. Document Revised: 05/28/2020 Document Reviewed: 05/28/2020 Elsevier Patient Education  2023 [employer]  

## 2022-03-21 ENCOUNTER — Encounter: Payer: Self-pay | Admitting: Nurse Practitioner

## 2022-03-21 ENCOUNTER — Ambulatory Visit (INDEPENDENT_AMBULATORY_CARE_PROVIDER_SITE_OTHER): Payer: BC Managed Care – PPO | Admitting: Nurse Practitioner

## 2022-03-21 VITALS — BP 110/70 | HR 84 | Temp 98.5°F | Wt 250.8 lb

## 2022-03-21 DIAGNOSIS — Z1159 Encounter for screening for other viral diseases: Secondary | ICD-10-CM

## 2022-03-21 DIAGNOSIS — I10 Essential (primary) hypertension: Secondary | ICD-10-CM

## 2022-03-21 DIAGNOSIS — N400 Enlarged prostate without lower urinary tract symptoms: Secondary | ICD-10-CM

## 2022-03-21 DIAGNOSIS — I2609 Other pulmonary embolism with acute cor pulmonale: Secondary | ICD-10-CM | POA: Diagnosis not present

## 2022-03-21 DIAGNOSIS — Z6832 Body mass index (BMI) 32.0-32.9, adult: Secondary | ICD-10-CM

## 2022-03-21 DIAGNOSIS — E559 Vitamin D deficiency, unspecified: Secondary | ICD-10-CM

## 2022-03-21 DIAGNOSIS — E7800 Pure hypercholesterolemia, unspecified: Secondary | ICD-10-CM

## 2022-03-21 DIAGNOSIS — R7303 Prediabetes: Secondary | ICD-10-CM

## 2022-03-21 NOTE — Assessment & Plan Note (Signed)
Chronic, ongoing.  Noted on past labs over 2 years ago, continues on supplement.  Recheck labs today.

## 2022-03-21 NOTE — Assessment & Plan Note (Signed)
Noted on hospital labs August 2023 at 5.8%, he reports he has been working on diet changes.  Discussed at length with him today.  Plan on recheck in 3-6 months.

## 2022-03-21 NOTE — Assessment & Plan Note (Addendum)
BMI 32.20.  Recommended eating smaller high protein, low fat meals more frequently and exercising 30 mins a day 5 times a week with a goal of 10-15lb weight loss in the next 3 months. Patient voiced their understanding and motivation to adhere to these recommendations.  

## 2022-03-21 NOTE — Assessment & Plan Note (Signed)
Stable at this time with BP at goal without medications.  Recommend he monitor BP at least a few mornings a week at home and document.  DASH diet at home.  Continue current medication regimen and adjust as needed.  Labs today: CBC, BMP.  Cardiology and sleep study referrals placed.

## 2022-03-21 NOTE — Progress Notes (Signed)
BP 110/70   Pulse 84   Temp 98.5 F (36.9 C) (Oral)   Wt 250 lb 12.8 oz (113.8 kg)   SpO2 98%   BMI 32.20 kg/m    Subjective:    Patient ID: Juan Russell, male    DOB: August 13, 1965, 56 y.o.   MRN: [id number]  HPI: [id number] Russell is a 56 y.o. male  Chief Complaint  Patient presents with   Pulmonary Embolism    Patient is here for a hospitalization follow up on Pulmonary Embolism on 02/22/22. Patient said he had operation to remove the blood clots from his lungs. Patient says he was prescribed blood thinners.    Transition of Care Hospital Follow up.  Admitted to [facility] on 02/22/22 for pulmonary embolism with cor pulmonale.  Was on way to work and having shortness of breath, which did not improve, so he took himself to ER.  Large bilateral pulmonary emboli noted.  Had mechanical thrombectomy arm and large amount of thrombus was removed.  Started on Eliquis and   "Bilateral submassive pulmonary embolism with cor pulmonale Presented with sudden onset shortness of breath. CTA with large bilateral pulmonary emboli with slight prominence of the right atrium and ventricle. Echocardiogram with mildly dilated right ventricle concerning for right heart strain. Trop 77-> 80-> 38. Works as a [employer], which is his biggest risk factor for venous thrombosis. Requiring 2 L oxygen nasal cannula. During 6 minute walk test he desaturated to the low 80's and required 4L [place] to improve his saturations. After thoughtful discussion and careful consideration, the patient consented for a clinical trial that randomized mechanical thrombectomy vs. EKOS. He was randomized to the mechanical thrombectomy arm which was performed, and a large amount of thrombus was removed from both the left and right pulmonary arteries with final pulmonary angiogram revealing improved flow in the bilateral pulmonary arterial circulation. Following the procedure his oxygen requirement diminished to room air, he was feeling much better and not  endorsing any shortness of breath or chest pain. He was started on Eliquis and ready for discharge.  Lower extremity edema  orthopnea  Reports needing several pillows to sleep at an angle, cannot be comfortable lying down flat. Off-and-on has lower leg edema. Together this is concerning for congestive heart failure. 02/22/22 ECHO w/ LVEF 55-60% and mildly dilated right ventricle. Could consider an outpatient sleep study.  Hyperlipidemia Lipid panel with LDL 143 and total cholesterol 215. Started atorvastatin 40 mg daily.  Hypertension No documented history of hypertension, but has not seen a doctor many years. Blood pressure with systolics ranging from 140-161/82-103. Follow-up with outpatient provider to start antihypertensive medications  Pre-Diabetes Hemoglobin A1c 5.8% on admission. Will need outpatient follow up for pre-diabetes education and lifestyle adjustments   Procedures: Mechanical thrombectomy"  Hospital/Facility: [facility] D/C Physician: Dr. Lockie Pares D/C Date: 02/24/22  Records Requested: 03/21/22 Records Received: 03/21/22 Records Reviewed: 03/21/22  Diagnoses on Discharge: Pulmonary Embolism  Date of interactive Contact within 48 hours of discharge:  Contact was through:  none  Date of 7 day or 14 day face-to-face visit: over 14 days  Outpatient Encounter Medications as of 03/21/2022  Medication Sig   apixaban (ELIQUIS) 5 MG TABS tablet Take 5 mg by mouth 2 (two) times daily.   atorvastatin (LIPITOR) 40 MG tablet Take 1 tablet by mouth daily.   CALCIUM PO Take 600 mg by mouth every other day.   Cholecalciferol 1.25 MG (50000 UT) TABS Take 1 tablet by mouth once a  week. For 8 weeks and then stop.  Return to office for lab draw.   CINNAMON PO Take by mouth daily.   GARLIC PO Take by mouth daily.   VITAMIN D PO Take by mouth daily.   [DISCONTINUED] doxycycline (VIBRA-TABS) 100 MG tablet Take 1 tablet (100 mg total) by mouth 2 (two) times daily. (Patient not taking: Reported  on 03/21/2022)   [DISCONTINUED] XARELTO 15 MG TABS tablet Take 15 mg by mouth 2 (two) times daily.   No facility-administered encounter medications on file as of 03/21/2022.   Diagnostic Tests Reviewed/Disposition: reviewed on chart under labs  Consults: cardiology  Discharge Instructions: Follow-up with PCP and consider cardiology and sleep study  Disease/illness Education: Reviewed with patient at length today  Home Health/Community Services Discussions/Referrals: None  Establishment or re-establishment of referral orders for community resources: None  Discussion with other health care providers: Reviewed with patient on chart  Assessment and Support of treatment regimen adherence: Reviewed with patient at length today  Appointments Coordinated with: Reviewed with patient at length today  Education for self-management, independent living, and ADLs:  Reviewed with patient at length today  Relevant past medical, surgical, family and social history reviewed and updated as indicated. Interim medical history since our last visit reviewed. Allergies and medications reviewed and updated.  Review of Systems  Constitutional:  Negative for activity change, appetite change, fatigue and fever.  Respiratory:  Negative for cough, chest tightness, shortness of breath and wheezing.   Cardiovascular:  Negative for chest pain, palpitations and leg swelling.  Gastrointestinal: Negative.   Endocrine: Negative for polydipsia, polyphagia and polyuria.  Neurological: Negative.   Psychiatric/Behavioral: Negative.      Per HPI unless specifically indicated above     Objective:    BP 110/70   Pulse 84   Temp 98.5 F (36.9 C) (Oral)   Wt 250 lb 12.8 oz (113.8 kg)   SpO2 98%   BMI 32.20 kg/m   Wt Readings from Last 3 Encounters:  03/21/22 250 lb 12.8 oz (113.8 kg)  12/16/20 240 lb (108.9 kg)  07/07/19 254 lb 12.8 oz (115.6 kg)    Physical Exam Vitals and nursing note reviewed.   Constitutional:      General: He is awake. He is not in acute distress.    Appearance: He is well-developed and well-groomed. He is obese. He is not ill-appearing or toxic-appearing.  HENT:     Head: Normocephalic and atraumatic.     Right Ear: Hearing normal. No drainage.     Left Ear: Hearing normal. No drainage.  Eyes:     General: Lids are normal.        Right eye: No discharge.        Left eye: No discharge.     Conjunctiva/sclera: Conjunctivae normal.     Pupils: Pupils are equal, round, and reactive to light.  Neck:     Thyroid: No thyromegaly.     Vascular: No carotid bruit.  Cardiovascular:     Rate and Rhythm: Normal rate and regular rhythm.     Heart sounds: Normal heart sounds, S1 normal and S2 normal. No murmur heard.    No gallop.  Pulmonary:     Effort: Pulmonary effort is normal. No accessory muscle usage or respiratory distress.     Breath sounds: Normal breath sounds.  Abdominal:     General: Bowel sounds are normal.     Palpations: Abdomen is soft.  Musculoskeletal:  General: Normal range of motion.     Cervical back: Normal range of motion and neck supple.     Right lower leg: No edema.     Left lower leg: No edema.  Lymphadenopathy:     Cervical: No cervical adenopathy.  Skin:    General: Skin is warm and dry.     Capillary Refill: Capillary refill takes less than 2 seconds.  Neurological:     Mental Status: He is alert and oriented to person, place, and time.     Deep Tendon Reflexes: Reflexes are normal and symmetric.  Psychiatric:        Attention and Perception: Attention normal.        Mood and Affect: Mood normal.        Speech: Speech normal.        Behavior: Behavior normal. Behavior is cooperative.        Thought Content: Thought content normal.    Results for orders placed or performed during the hospital encounter of 12/17/20  CBC  Result Value Ref Range   WBC 5.0 4.0 - 10.5 K/uL   RBC 4.93 4.22 - 5.81 MIL/uL   Hemoglobin  14.6 13.0 - 17.0 g/dL   HCT 35.0 09.3 - 81.8 %   MCV 86.8 80.0 - 100.0 fL   MCH 29.6 26.0 - 34.0 pg   MCHC 34.1 30.0 - 36.0 g/dL   RDW 29.9 37.1 - 69.6 %   Platelets 251 150 - 400 K/uL   nRBC 0.0 0.0 - 0.2 %  Comprehensive metabolic panel  Result Value Ref Range   Sodium 137 135 - 145 mmol/L   Potassium 3.7 3.5 - 5.1 mmol/L   Chloride 106 98 - 111 mmol/L   CO2 23 22 - 32 mmol/L   Glucose, Bld 141 (H) 70 - 99 mg/dL   BUN 19 6 - 20 mg/dL   Creatinine, Ser 7.89 (H) 0.61 - 1.24 mg/dL   Calcium 8.8 (L) 8.9 - 10.3 mg/dL   Total Protein 7.1 6.5 - 8.1 g/dL   Albumin 3.9 3.5 - 5.0 g/dL   AST 27 15 - 41 U/L   ALT 21 0 - 44 U/L   Alkaline Phosphatase 81 38 - 126 U/L   Total Bilirubin 0.7 0.3 - 1.2 mg/dL   GFR, Estimated >38 >10 mL/min   Anion gap 8 5 - 15  Urinalysis, Complete w Microscopic  Result Value Ref Range   Color, Urine YELLOW (A) YELLOW   APPearance CLEAR (A) CLEAR   Specific Gravity, Urine 1.017 1.005 - 1.030   pH 5.0 5.0 - 8.0   Glucose, UA NEGATIVE NEGATIVE mg/dL   Hgb urine dipstick NEGATIVE NEGATIVE   Bilirubin Urine NEGATIVE NEGATIVE   Ketones, ur NEGATIVE NEGATIVE mg/dL   Protein, ur NEGATIVE NEGATIVE mg/dL   Nitrite NEGATIVE NEGATIVE   Leukocytes,Ua NEGATIVE NEGATIVE   RBC / HPF 0-5 0 - 5 RBC/hpf   WBC, UA 0-5 0 - 5 WBC/hpf   Bacteria, UA NONE SEEN NONE SEEN   Squamous Epithelial / LPF 0-5 0 - 5   Mucus PRESENT   Troponin I (High Sensitivity)  Result Value Ref Range   Troponin I (High Sensitivity) 4 <18 ng/L  Troponin I (High Sensitivity)  Result Value Ref Range   Troponin I (High Sensitivity) 3 <18 ng/L      Assessment & Plan:   Problem List Items Addressed This Visit       Cardiovascular and Mediastinum   Hypertension  Stable at this time with BP at goal without medications.  Recommend he monitor BP at least a few mornings a week at home and document.  DASH diet at home.  Continue current medication regimen and adjust as needed.  Labs today: CBC,  BMP.  Cardiology and sleep study referrals placed.       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  Current guidelines recommend initial treatment period of 3 months per [employer].  Referral to cardiology and sleep study clinic.  Labs today.      Relevant Orders   Basic metabolic panel   CBC with Differential/Platelet   Lipid Panel w/o Chol/HDL Ratio   Ambulatory referral to Cardiology   Ambulatory referral to Sleep Studies     Other   BMI 32.0-32.9,adult    BMI 32.20.  Recommended eating smaller high protein, low fat meals more frequently and exercising 30 mins a day 5 times a week with a goal of 10-15lb weight loss in the next 3 months. Patient voiced their understanding and motivation to adhere to these recommendations.       Hypercholesteremia    Chronic, ongoing.  At this time continue medication and adjust as needed.  Lipid panel today.      Relevant Orders   Lipid Panel w/o Chol/HDL Ratio   Ambulatory referral to Cardiology   Ambulatory referral to Sleep Studies   Pre-diabetes    Noted on hospital labs August 2023 at 5.8%, he reports he has been working on diet changes.  Discussed at length with him today.  Plan on recheck in 3-6 months.      Relevant Orders   Basic metabolic panel   Vitamin D deficiency    Chronic, ongoing.  Noted on past labs over 2 years ago, continues on supplement.  Recheck labs today.      Relevant Orders   VITAMIN D 25 Hydroxy (Vit-D Deficiency, Fractures)   Other Visit Diagnoses     Benign prostatic hyperplasia without lower urinary tract symptoms       PSA on labs today.   Relevant Orders   PSA   Need for hepatitis C screening test       Hep C screen on  labs today per guidelines for one time screening, discussed with patient.   Relevant Orders   Hepatitis C antibody        Follow up plan: Return in about 8 weeks (around 05/16/2022) for PE AND HLD.

## 2022-03-21 NOTE — Assessment & Plan Note (Signed)
Chronic, ongoing.  At this time continue medication and adjust as needed.  Lipid panel today. 

## 2022-03-21 NOTE — Assessment & Plan Note (Signed)
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  At this time continue Eliquis.  Current guidelines recommend initial treatment period of 3 months per [employer].  Referral to cardiology and sleep study clinic.  Labs today.

## 2022-03-22 LAB — CBC WITH DIFFERENTIAL/PLATELET
Basophils Absolute: 0 10*3/uL (ref 0.0–0.2)
Basos: 1 %
EOS (ABSOLUTE): 0.2 10*3/uL (ref 0.0–0.4)
Eos: 6 %
Hematocrit: 42.2 % (ref 37.5–51.0)
Hemoglobin: 14.1 g/dL (ref 13.0–17.7)
Immature Grans (Abs): 0 10*3/uL (ref 0.0–0.1)
Immature Granulocytes: 0 %
Lymphocytes Absolute: 1.4 10*3/uL (ref 0.7–3.1)
Lymphs: 36 %
MCH: 29 pg (ref 26.6–33.0)
MCHC: 33.4 g/dL (ref 31.5–35.7)
MCV: 87 fL (ref 79–97)
Monocytes Absolute: 0.4 10*3/uL (ref 0.1–0.9)
Monocytes: 11 %
Neutrophils Absolute: 1.8 10*3/uL (ref 1.4–7.0)
Neutrophils: 46 %
Platelets: 243 10*3/uL (ref 150–450)
RBC: 4.86 x10E6/uL (ref 4.14–5.80)
RDW: 13.2 % (ref 11.6–15.4)
WBC: 3.9 10*3/uL (ref 3.4–10.8)

## 2022-03-22 LAB — BASIC METABOLIC PANEL
BUN/Creatinine Ratio: 13 (ref 9–20)
BUN: 20 mg/dL (ref 6–24)
CO2: 21 mmol/L (ref 20–29)
Calcium: 9.2 mg/dL (ref 8.7–10.2)
Chloride: 106 mmol/L (ref 96–106)
Creatinine, Ser: 1.52 mg/dL — ABNORMAL HIGH (ref 0.76–1.27)
Glucose: 100 mg/dL — ABNORMAL HIGH (ref 70–99)
Potassium: 4.1 mmol/L (ref 3.5–5.2)
Sodium: 140 mmol/L (ref 134–144)
eGFR: 54 mL/min/{1.73_m2} — ABNORMAL LOW (ref >59)

## 2022-03-22 LAB — LIPID PANEL W/O CHOL/HDL RATIO
Cholesterol, Total: 175 mg/dL (ref 100–199)
HDL: 41 mg/dL (ref >39)
LDL Chol Calc (NIH): 102 mg/dL — ABNORMAL HIGH (ref 0–99)
Triglycerides: 187 mg/dL — ABNORMAL HIGH (ref 0–149)
VLDL Cholesterol Cal: 32 mg/dL (ref 5–40)

## 2022-03-22 LAB — HEPATITIS C ANTIBODY: Hep C Virus Ab: NONREACTIVE

## 2022-03-22 LAB — PSA: Prostate Specific Ag, Serum: 0.5 ng/mL (ref 0.0–4.0)

## 2022-03-22 LAB — VITAMIN D 25 HYDROXY (VIT D DEFICIENCY, FRACTURES): Vit D, 25-Hydroxy: 18 ng/mL — ABNORMAL LOW (ref 30.0–100.0)

## 2022-03-22 NOTE — Progress Notes (Signed)
Good morning, please let Juan Russell know his labs have returned: - Kidney function continues to show some mild chronic kidney disease stage 3a, continue focus on heart healthy diet -- less salt and lots of water. - Cholesterol labs show LDL a little above goal -- please ensure you are taking Atorvastatin daily and we may adjust this dosing next visit if still above goal, please fast for next visit. - Vitamin D on low side, please ensure you take Vitamin D3 2000 units daily for bone health. - Remainder of labs are all stable.  Any questions? Keep being amazing!!  Thank you for allowing me to participate in your care.  I appreciate you. Kindest regards, Vuk Skillern

## 2022-03-27 DIAGNOSIS — I2609 Other pulmonary embolism with acute cor pulmonale: Secondary | ICD-10-CM | POA: Diagnosis not present

## 2022-03-27 DIAGNOSIS — Z86711 Personal history of pulmonary embolism: Secondary | ICD-10-CM | POA: Diagnosis not present

## 2022-03-27 DIAGNOSIS — Z823 Family history of stroke: Secondary | ICD-10-CM | POA: Diagnosis not present

## 2022-03-27 DIAGNOSIS — Z6832 Body mass index (BMI) 32.0-32.9, adult: Secondary | ICD-10-CM | POA: Diagnosis not present

## 2022-03-27 DIAGNOSIS — I10 Essential (primary) hypertension: Secondary | ICD-10-CM | POA: Diagnosis not present

## 2022-03-27 DIAGNOSIS — E669 Obesity, unspecified: Secondary | ICD-10-CM | POA: Diagnosis not present

## 2022-03-27 DIAGNOSIS — E785 Hyperlipidemia, unspecified: Secondary | ICD-10-CM | POA: Diagnosis not present

## 2022-03-27 DIAGNOSIS — Z7901 Long term (current) use of anticoagulants: Secondary | ICD-10-CM | POA: Diagnosis not present

## 2022-04-25 DIAGNOSIS — R350 Frequency of micturition: Secondary | ICD-10-CM | POA: Diagnosis not present

## 2022-04-25 DIAGNOSIS — N410 Acute prostatitis: Secondary | ICD-10-CM | POA: Diagnosis not present

## 2022-05-01 ENCOUNTER — Encounter: Payer: Self-pay | Admitting: Neurology

## 2022-05-01 ENCOUNTER — Institutional Professional Consult (permissible substitution): Payer: Self-pay | Admitting: Neurology

## 2022-05-20 NOTE — Patient Instructions (Signed)

## 2022-05-22 ENCOUNTER — Encounter: Payer: Self-pay | Admitting: Nurse Practitioner

## 2022-05-22 ENCOUNTER — Ambulatory Visit (INDEPENDENT_AMBULATORY_CARE_PROVIDER_SITE_OTHER): Payer: BC Managed Care – PPO | Admitting: Nurse Practitioner

## 2022-05-22 VITALS — BP 120/75 | HR 80 | Temp 98.8°F | Ht 74.02 in | Wt 247.7 lb

## 2022-05-22 DIAGNOSIS — Z6831 Body mass index (BMI) 31.0-31.9, adult: Secondary | ICD-10-CM

## 2022-05-22 DIAGNOSIS — N1831 Chronic kidney disease, stage 3a: Secondary | ICD-10-CM

## 2022-05-22 DIAGNOSIS — E6609 Other obesity due to excess calories: Secondary | ICD-10-CM

## 2022-05-22 DIAGNOSIS — I2609 Other pulmonary embolism with acute cor pulmonale: Secondary | ICD-10-CM

## 2022-05-22 DIAGNOSIS — E7800 Pure hypercholesterolemia, unspecified: Secondary | ICD-10-CM

## 2022-05-22 DIAGNOSIS — Z23 Encounter for immunization: Secondary | ICD-10-CM | POA: Diagnosis not present

## 2022-05-22 MED ORDER — ATORVASTATIN CALCIUM 40 MG PO TABS: 40.0000 mg | ORAL_TABLET | Freq: Every day | ORAL | 4 refills | Status: DC

## 2022-05-22 NOTE — Assessment & Plan Note (Signed)
Noted past labs, recheck labs today and consider addition of ACE or ARB in future if would tolerate.

## 2022-05-22 NOTE — Assessment & Plan Note (Signed)
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  Current guidelines recommend initial treatment period of 3 months per [employer], recent visit with hematology -- to maintain on board until next visit with them.  Appreciate their input.

## 2022-05-22 NOTE — Progress Notes (Signed)
BP 120/75   Pulse 80   Temp 98.8 F (37.1 C) (Oral)   Ht 6' 2.02" (1.88 m)   Wt 247 lb 11.2 oz (112.4 kg)   SpO2 96%   BMI 31.79 kg/m    Subjective:    Patient ID: Juan Russell, male    DOB: 1966/02/24, 56 y.o.   MRN: [id number]  HPI: Juan Russell is a 56 y.o. male  Chief Complaint  Patient presents with   PE   Hyperlipidemia   HYPERLIPIDEMIA Continues on Atorvastatin 40 MG daily. Is taking Eliquis for history of PE, is to stay on this until next visit with hematology.  Last visit was 03/27/22.   Hyperlipidemia status: good compliance Satisfied with current treatment?  yes Side effects:  no Medication compliance: good compliance Supplements: none Aspirin:  no The 10-year ASCVD risk score (Arnett DK, et al., 2019) is: 6.1%   Values used to calculate the score:     Age: 22 years     Sex: Male     Is Non-Hispanic African American: Yes     Diabetic: No     Tobacco smoker: No     Systolic Blood Pressure: 003 mmHg     Is BP treated: No     HDL Cholesterol: 41 mg/dL     Total Cholesterol: 175 mg/dL Chest pain:  no Coronary artery disease:  no Family history CAD:  no Family history early CAD:  no   CHRONIC KIDNEY DISEASE Noted on past labs, reports he is urinating better now. CKD status: stable Medications renally dose: yes Previous renal evaluation: no Pneumovax:  Not up to Date Influenza Vaccine:  Not up to Date   Relevant past medical, surgical, family and social history reviewed and updated as indicated. Interim medical history since our last visit reviewed. Allergies and medications reviewed and updated.  Review of Systems  Constitutional:  Negative for activity change, appetite change, fatigue and fever.  Respiratory:  Negative for cough, chest tightness, shortness of breath and wheezing.   Cardiovascular:  Negative for chest pain, palpitations and leg swelling.  Gastrointestinal: Negative.   Endocrine: Negative for polydipsia, polyphagia and polyuria.   Neurological: Negative.   Psychiatric/Behavioral: Negative.      Per HPI unless specifically indicated above     Objective:    BP 120/75   Pulse 80   Temp 98.8 F (37.1 C) (Oral)   Ht 6' 2.02" (1.88 m)   Wt 247 lb 11.2 oz (112.4 kg)   SpO2 96%   BMI 31.79 kg/m   Wt Readings from Last 3 Encounters:  05/22/22 247 lb 11.2 oz (112.4 kg)  03/21/22 250 lb 12.8 oz (113.8 kg)  12/16/20 240 lb (108.9 kg)    Physical Exam Vitals and nursing note reviewed.  Constitutional:      General: He is awake. He is not in acute distress.    Appearance: He is well-developed and well-groomed. He is obese. He is not ill-appearing or toxic-appearing.  HENT:     Head: Normocephalic and atraumatic.     Right Ear: Hearing normal. No drainage.     Left Ear: Hearing normal. No drainage.  Eyes:     General: Lids are normal.        Right eye: No discharge.        Left eye: No discharge.     Conjunctiva/sclera: Conjunctivae normal.     Pupils: Pupils are equal, round, and reactive to light.  Neck:  Thyroid: No thyromegaly.     Vascular: No carotid bruit.  Cardiovascular:     Rate and Rhythm: Normal rate and regular rhythm.     Heart sounds: Normal heart sounds, S1 normal and S2 normal. No murmur heard.    No gallop.  Pulmonary:     Effort: Pulmonary effort is normal. No accessory muscle usage or respiratory distress.     Breath sounds: Normal breath sounds.  Abdominal:     General: Bowel sounds are normal.     Palpations: Abdomen is soft.  Musculoskeletal:        General: Normal range of motion.     Cervical back: Normal range of motion and neck supple.     Right lower leg: No edema.     Left lower leg: No edema.  Lymphadenopathy:     Cervical: No cervical adenopathy.  Skin:    General: Skin is warm and dry.     Capillary Refill: Capillary refill takes less than 2 seconds.  Neurological:     Mental Status: He is alert and oriented to person, place, and time.     Deep Tendon  Reflexes: Reflexes are normal and symmetric.  Psychiatric:        Attention and Perception: Attention normal.        Mood and Affect: Mood normal.        Speech: Speech normal.        Behavior: Behavior normal. Behavior is cooperative.        Thought Content: Thought content normal.     Results for orders placed or performed in visit on 60/10/93  Basic metabolic panel  Result Value Ref Range   Glucose 100 (H) 70 - 99 mg/dL   BUN 20 6 - 24 mg/dL   Creatinine, Ser 1.52 (H) 0.76 - 1.27 mg/dL   eGFR 54 (L) >59 mL/min/1.73   BUN/Creatinine Ratio 13 9 - 20   Sodium 140 134 - 144 mmol/L   Potassium 4.1 3.5 - 5.2 mmol/L   Chloride 106 96 - 106 mmol/L   CO2 21 20 - 29 mmol/L   Calcium 9.2 8.7 - 10.2 mg/dL  CBC with Differential/Platelet  Result Value Ref Range   WBC 3.9 3.4 - 10.8 x10E3/uL   RBC 4.86 4.14 - 5.80 x10E6/uL   Hemoglobin 14.1 13.0 - 17.7 g/dL   Hematocrit 42.2 37.5 - 51.0 %   MCV 87 79 - 97 fL   MCH 29.0 26.6 - 33.0 pg   MCHC 33.4 31.5 - 35.7 g/dL   RDW 13.2 11.6 - 15.4 %   Platelets 243 150 - 450 x10E3/uL   Neutrophils 46 Not Estab. %   Lymphs 36 Not Estab. %   Monocytes 11 Not Estab. %   Eos 6 Not Estab. %   Basos 1 Not Estab. %   Neutrophils Absolute 1.8 1.4 - 7.0 x10E3/uL   Lymphocytes Absolute 1.4 0.7 - 3.1 x10E3/uL   Monocytes Absolute 0.4 0.1 - 0.9 x10E3/uL   EOS (ABSOLUTE) 0.2 0.0 - 0.4 x10E3/uL   Basophils Absolute 0.0 0.0 - 0.2 x10E3/uL   Immature Granulocytes 0 Not Estab. %   Immature Grans (Abs) 0.0 0.0 - 0.1 x10E3/uL  Lipid Panel w/o Chol/HDL Ratio  Result Value Ref Range   Cholesterol, Total 175 100 - 199 mg/dL   Triglycerides 187 (H) 0 - 149 mg/dL   HDL 41 >39 mg/dL   VLDL Cholesterol Cal 32 5 - 40 mg/dL   LDL Chol Calc (NIH) 102 (  H) 0 - 99 mg/dL  PSA  Result Value Ref Range   Prostate Specific Ag, Serum 0.5 0.0 - 4.0 ng/mL  VITAMIN D 25 Hydroxy (Vit-D Deficiency, Fractures)  Result Value Ref Range   Vit D, 25-Hydroxy 18.0 (L) 30.0 - 100.0  ng/mL  Hepatitis C antibody  Result Value Ref Range   Hep C Virus Ab Non Reactive Non Reactive      Assessment & Plan:   Problem List Items Addressed This Visit       Cardiovascular and Mediastinum   Other pulmonary embolism with acute cor pulmonale ([place])    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  Current guidelines recommend initial treatment period of 3 months per [employer] [facility], recent visit with hematology -- to maintain on board until next visit with them.  Appreciate their input.      Relevant Medications   atorvastatin (LIPITOR) 40 MG tablet     Genitourinary   Stage 3a chronic kidney disease ([place]) - Primary    Noted past labs, recheck labs today and consider addition of ACE or ARB in future if would tolerate.      Relevant Orders   Comprehensive metabolic panel     Other   Hypercholesteremia    Chronic, ongoing.  At this time continue medication and adjust as needed.  Lipid panel today.      Relevant Medications   atorvastatin (LIPITOR) 40 MG tablet   Other Relevant Orders   Comprehensive metabolic panel   Lipid Panel w/o Chol/HDL Ratio   Obesity    BMI 31.79.  Recommended eating smaller high protein, low fat meals more frequently and exercising 30 mins a day 5 times a week with a goal of 10-15lb weight loss in the next 3 months. Patient voiced their understanding and motivation to adhere to these recommendations.         Follow up plan: Return in about 6 months (around 11/20/2022) for PE AND HLD.

## 2022-05-22 NOTE — Assessment & Plan Note (Signed)
Chronic, ongoing.  At this time continue medication and adjust as needed.  Lipid panel today.

## 2022-05-22 NOTE — Assessment & Plan Note (Signed)
BMI 31.79.  Recommended eating smaller high protein, low fat meals more frequently and exercising 30 mins a day 5 times a week with a goal of 10-15lb weight loss in the next 3 months. Patient voiced their understanding and motivation to adhere to these recommendations.  

## 2022-05-23 LAB — LIPID PANEL W/O CHOL/HDL RATIO
Cholesterol, Total: 176 mg/dL (ref 100–199)
HDL: 43 mg/dL (ref >39)
LDL Chol Calc (NIH): 106 mg/dL — ABNORMAL HIGH (ref 0–99)
Triglycerides: 152 mg/dL — ABNORMAL HIGH (ref 0–149)
VLDL Cholesterol Cal: 27 mg/dL (ref 5–40)

## 2022-05-23 LAB — COMPREHENSIVE METABOLIC PANEL
ALT: 17 IU/L (ref 0–44)
AST: 19 IU/L (ref 0–40)
Albumin/Globulin Ratio: 2 (ref 1.2–2.2)
Albumin: 4.1 g/dL (ref 3.8–4.9)
Alkaline Phosphatase: 107 IU/L (ref 44–121)
BUN/Creatinine Ratio: 10 (ref 9–20)
BUN: 13 mg/dL (ref 6–24)
Bilirubin Total: 0.3 mg/dL (ref 0.0–1.2)
CO2: 21 mmol/L (ref 20–29)
Calcium: 9.2 mg/dL (ref 8.7–10.2)
Chloride: 107 mmol/L — ABNORMAL HIGH (ref 96–106)
Creatinine, Ser: 1.28 mg/dL — ABNORMAL HIGH (ref 0.76–1.27)
Globulin, Total: 2.1 g/dL (ref 1.5–4.5)
Glucose: 94 mg/dL (ref 70–99)
Potassium: 4.1 mmol/L (ref 3.5–5.2)
Sodium: 142 mmol/L (ref 134–144)
Total Protein: 6.2 g/dL (ref 6.0–8.5)
eGFR: 66 mL/min/{1.73_m2} (ref >59)

## 2022-05-23 NOTE — Progress Notes (Signed)
Good morning, please let Juan Russell know his labs have returned: - Kidney function is improving this check and moving towards normal range. Continue good water intake daily. - Cholesterol labs continue to show LDL, bad cholesterol, level above goal.  Are you taking Atorvastatin daily?  Let me know as we may need to change to Rosuvastatin for better control of levels and to help prevent stroke and heart attack.  Any questions? Keep being amazing!!  Thank you for allowing me to participate in your care.  I appreciate you. Kindest regards, Deontray Hunnicutt

## 2022-08-13 IMAGING — MR MR HEAD W/O CM
12 series · 45 of 48 positions shown · non-contrast
Comparison: None.

CLINICAL DATA: Dizziness. Leaning to 1 side. Headache. Duration of
symptoms 1 week.

EXAM:
MRI HEAD WITHOUT CONTRAST
TECHNIQUE: Multiplanar, multiecho pulse sequences of the brain and surrounding
structures were obtained without intravenous contrast.

[Series 5: ax dwi_tracew · axial · 3.0mm · 0.65mm/px · z∈[-64,+110]mm · 6 of 107 slices shown]
[im 1/107]
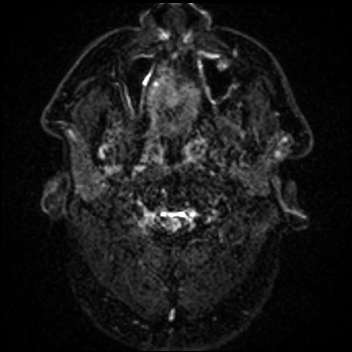
[im 22/107]
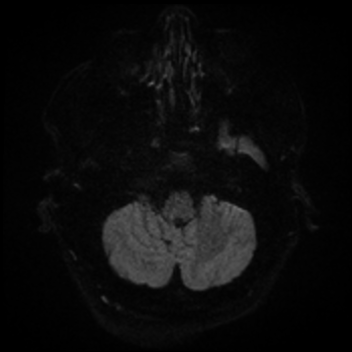
[im 43/107]
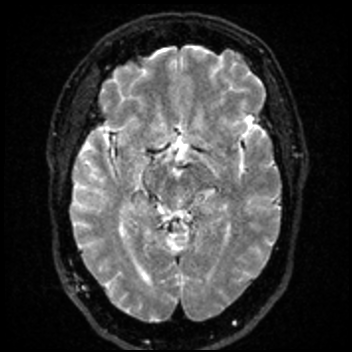
[im 64/107]
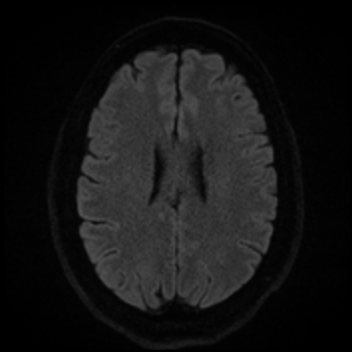
[im 85/107]
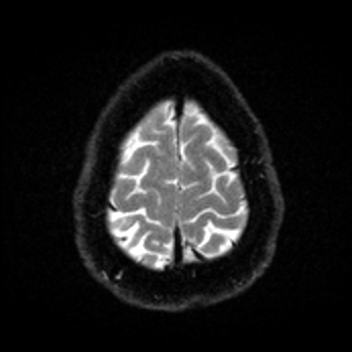
[im 107/107]
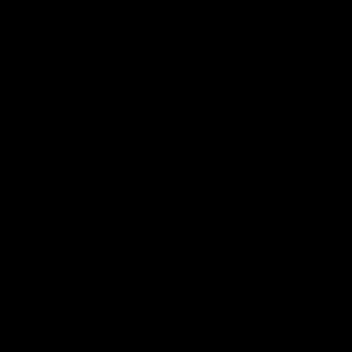

[Series 6: ax dwi_adc · axial · 3.0mm · 0.65mm/px · z∈[-64,+103]mm · 3 of 52 slices shown]
[im 1/52]
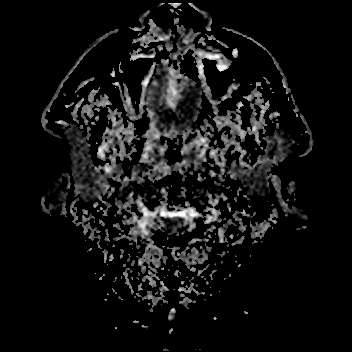
[im 26/52]
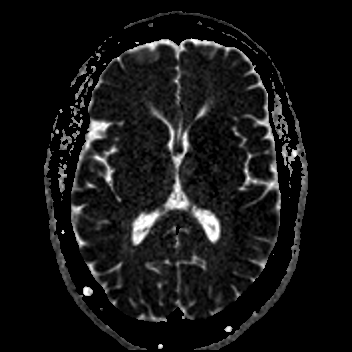
[im 52/52]
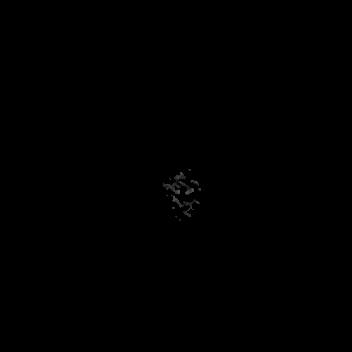

[Series 7: cor dwi_tracew · coronal · 5.0mm · 0.65mm/px · 5 of 80 slices shown]
[im 1/80]
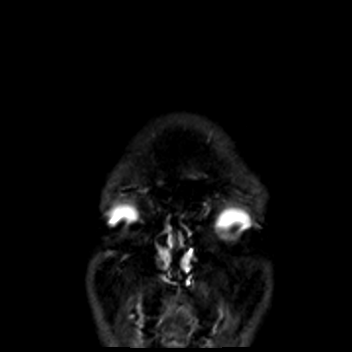
[im 20/80]
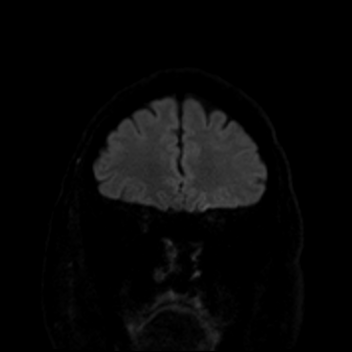
[im 40/80]
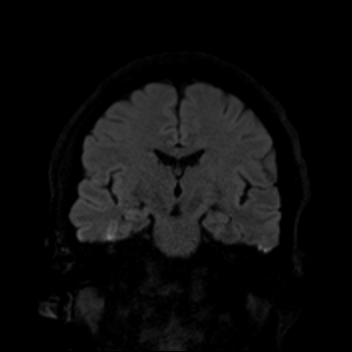
[im 60/80]
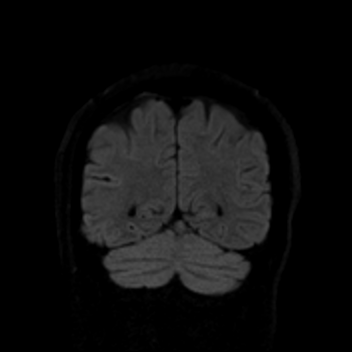
[im 80/80]
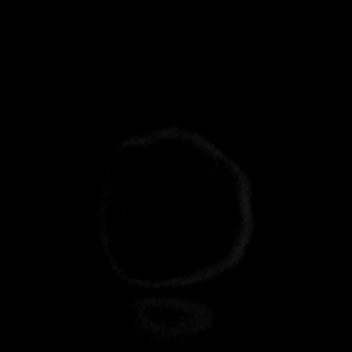

[Series 8: cor dwi_adc · coronal · 5.0mm · 0.65mm/px · 2 of 40 slices shown]
[im 1/40]
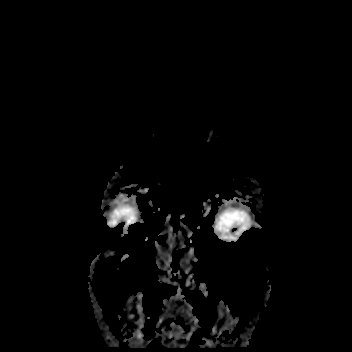
[im 40/40]
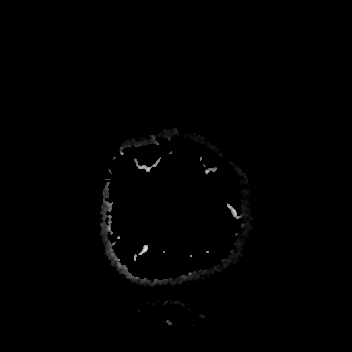

[Series 9: T1 · sagittal · 5.0mm · 0.62mm/px · 2 of 25 slices shown (1 of 2)]
[im 1/25]
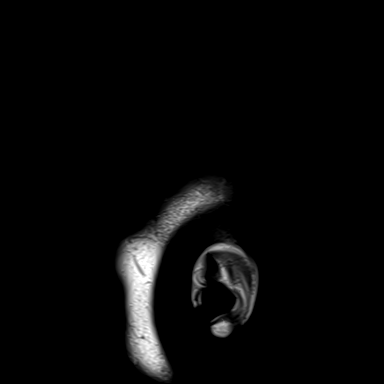
[im 25/25]
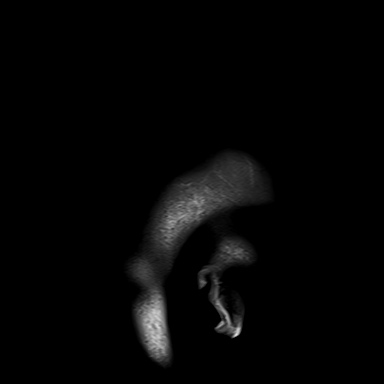

[Series 10: T2 · axial · 5.0mm · 0.53mm/px · z∈[-61,+106]mm · 2 of 29 slices shown (1 of 2)]
[im 1/29]
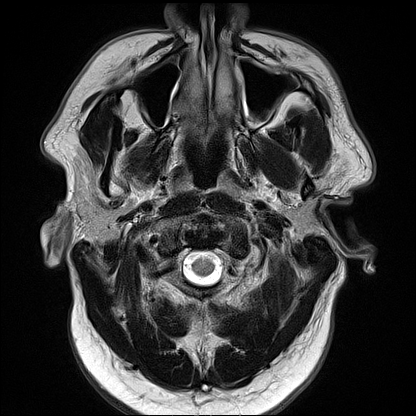
[im 29/29]
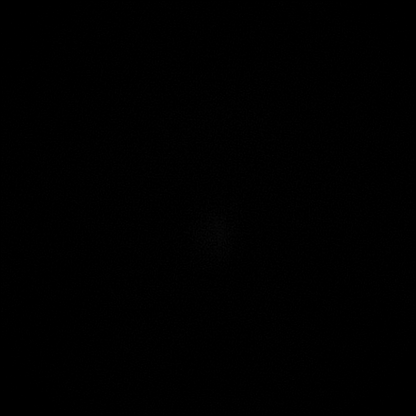

[Series 11: mag_images · axial · 3.0mm · 0.90mm/px · z∈[-65,+111]mm · 4 of 60 slices shown]
[im 1/60]
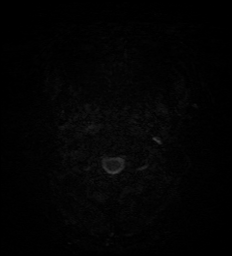
[im 20/60]
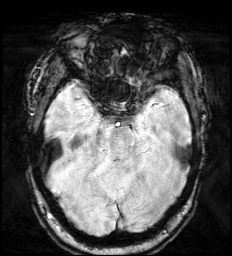
[im 40/60]
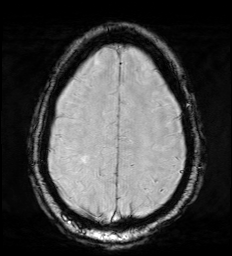
[im 60/60]
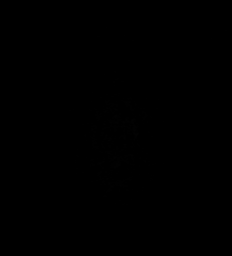

[Series 12: pha_images · axial · 3.0mm · 0.90mm/px · z∈[-65,+111]mm · 4 of 59 slices shown]
[im 1/59]
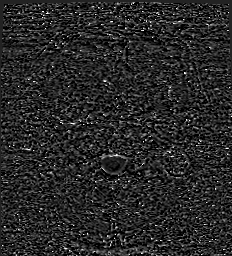
[im 20/59]
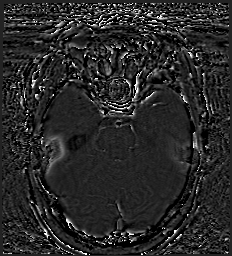
[im 39/59]
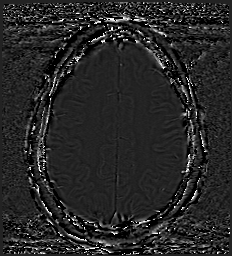
[im 59/59]
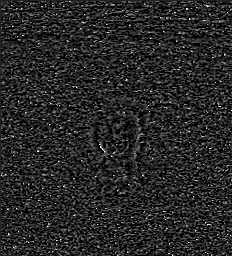

[Series 13: swi_images · axial · 3.0mm · 0.90mm/px · z∈[-65,+111]mm · 4 of 60 slices shown]
[im 1/60]
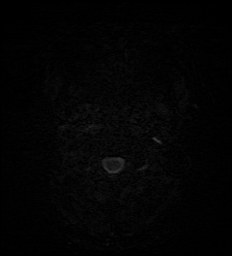
[im 20/60]
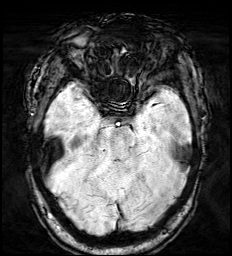
[im 40/60]
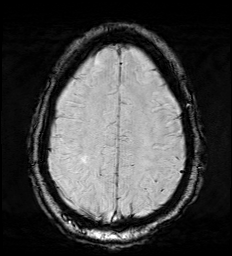
[im 60/60]
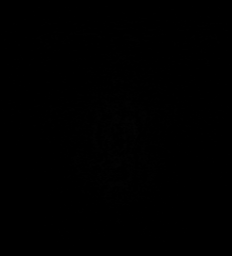

[Series 15: FLAIR · axial · 3.0mm · 0.53mm/px · z∈[-58,+103]mm · 3 of 55 slices shown]
[im 1/55]
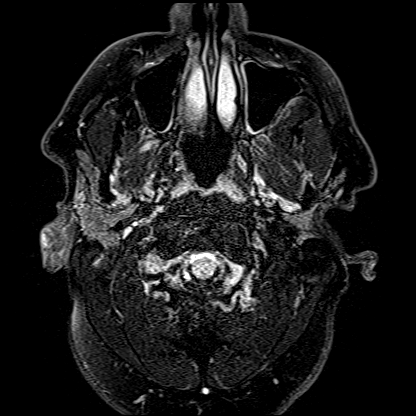
[im 28/55]
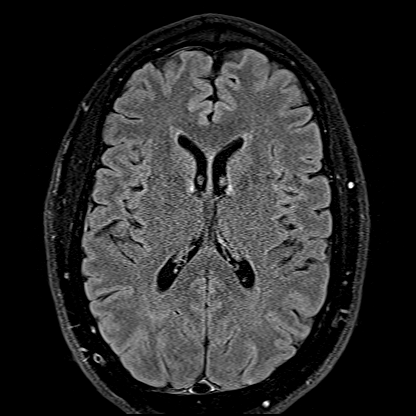
[im 55/55]
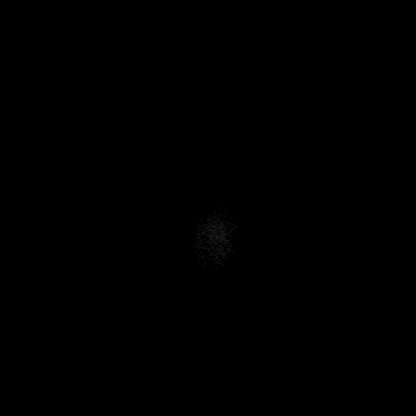

[Series 16: T1 · axial · 1.0mm · 0.98mm/px · z∈[-63,+109]mm · 8 of 174 slices shown (2 of 2)]
[im 1/174]
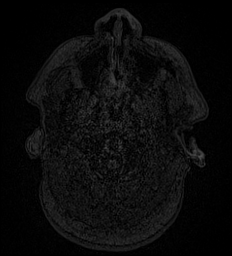
[im 35/174]
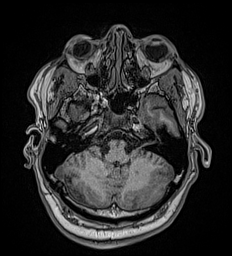
[im 52/174]
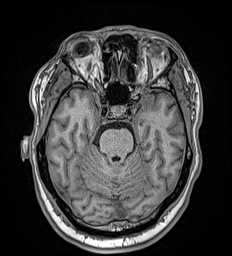
[im 70/174]
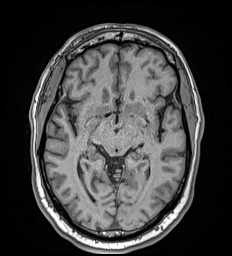
[im 104/174]
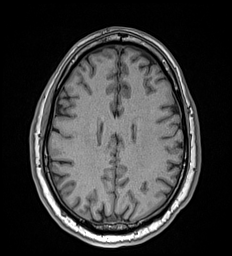
[im 122/174]
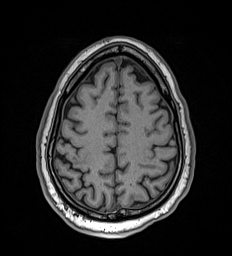
[im 139/174]
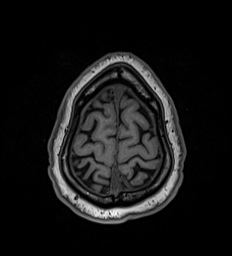
[im 174/174]
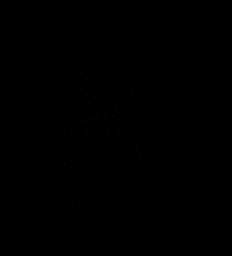

[Series 17: T2 · coronal · 5.0mm · 0.57mm/px · 2 of 32 slices shown (2 of 2)]
[im 1/32]
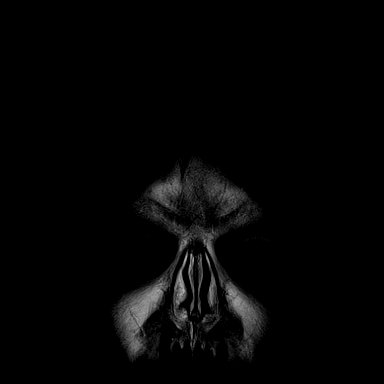
[im 32/32]
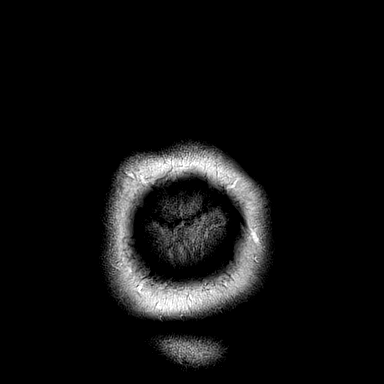

[45 of 48 positions shown; findings below may reference images not displayed]

FINDINGS: Brain: Diffusion imaging does not show any acute or subacute
infarction. No abnormality affects the brainstem or cerebellum. The
left cerebral hemisphere is normal. On the right, there are what I
believe are a grouping of dilated perivascular spaces in the right
parietal region affecting both the deep white matter just above the
corpus callosum as well as extending into the more superficial
subcortical white matter of what is probably the post central gyrus.
I do not think that these are sequela of infarctions. This is not
consistent with a mass lesion or vascular lesion. Because of the
unusual pattern, I do think it would be reasonable to perform a
follow-up study in 3 months both with and without contrast to show
that these are stable.

No hemorrhage, hydrocephalus or extra-axial collection.

Vascular: Major vessels at the base of the brain show flow.

Skull and upper cervical spine: Negative

Sinuses/Orbits: Minimal mucosal thickening. No advanced sinus
inflammation. Orbits negative.

Other: None
IMPRESSION: No acute finding. There are what I believe represent dilated
perivascular spaces in the right parietal brain as described above.
Because of the somewhat unusual distribution, a follow-up study in 3
months with and without contrast is recommended to show stability
and give us further confidence that these are not clinically
important.

## 2022-10-20 ENCOUNTER — Other Ambulatory Visit: Payer: Self-pay | Admitting: Nurse Practitioner

## 2022-10-20 MED ORDER — APIXABAN 5 MG PO TABS: 5.0000 mg | ORAL_TABLET | Freq: Two times a day (BID) | ORAL | 1 refills | Status: DC

## 2022-10-20 NOTE — Telephone Encounter (Signed)
Copied from CRM [id number]. Topic: General - Other >> Oct 20, 2022  9:34 AM Everette C wrote: Reason for CRM: Medication Refill - Medication: apixaban (ELIQUIS) 5 MG TABS tablet [[id number]]  Has the patient contacted their pharmacy? Yes.   (Agent: If no, request that the patient contact the pharmacy for the refill. If patient does not wish to contact the pharmacy document the reason why and proceed with request.) (Agent: If yes, when and what did the pharmacy advise?)  Preferred Pharmacy (with phone number or street name): [facility] [address]), [place] - [address]) [place] [address] Phone: [phone] Fax: [phone] Hours: Not open 24 hours   Has the patient been seen for an appointment in the last year OR does the patient have an upcoming appointment? Yes.    Agent: Please be advised that RX refills may take up to 3 business days. We ask that you follow-up with your pharmacy.

## 2022-10-20 NOTE — Telephone Encounter (Signed)
Requested medication (s) are due for refill today: historical medication  Requested medication (s) are on the active medication list: yes   Last refill:  02/24/22- 04/01/22  Future visit scheduled: yes in 1 month   Notes to clinic:  historical medication. Do you want to order Rx?     Requested Prescriptions  Pending Prescriptions Disp Refills   apixaban (ELIQUIS) 5 MG TABS tablet 60 tablet 1    Sig: Take 1 tablet (5 mg total) by mouth 2 (two) times daily.     Hematology:  Anticoagulants - apixaban Failed - 10/20/2022 10:57 AM      Failed - Cr in normal range and within 360 days    Creatinine  Date Value Ref Range Status  07/22/2014 1.21 0.60 - 1.30 mg/dL Final   Creatinine, Ser  Date Value Ref Range Status  05/22/2022 1.28 (H) 0.76 - 1.27 mg/dL Final         Passed - PLT in normal range and within 360 days    Platelets  Date Value Ref Range Status  03/21/2022 243 150 - 450 x10E3/uL Final         Passed - HGB in normal range and within 360 days    Hemoglobin  Date Value Ref Range Status  03/21/2022 14.1 13.0 - 17.7 g/dL Final         Passed - HCT in normal range and within 360 days    Hematocrit  Date Value Ref Range Status  03/21/2022 42.2 37.5 - 51.0 % Final         Passed - AST in normal range and within 360 days    AST  Date Value Ref Range Status  05/22/2022 19 0 - 40 IU/L Final         Passed - ALT in normal range and within 360 days    ALT  Date Value Ref Range Status  05/22/2022 17 0 - 44 IU/L Final         Passed - Valid encounter within last 12 months    Recent Outpatient Visits           5 months ago Stage 3a chronic kidney disease (HCC)   [place] [facility] [place], Jolene T, NP   7 months ago Other acute pulmonary embolism with acute cor pulmonale (HCC)   [place] [facility] [place], Corrie Dandy T, NP   3 years ago Screening for HIV (human immunodeficiency virus)   [place] [facility] [place],  Corrie Dandy T, NP   4 years ago Essential hypertension   [place] [facility] [place], Sharon T, NP   5 years ago Prostate cancer screening   [place] [facility] Gabriel Cirri, NP       Future Appointments             In 1 month [place], Dorie Rank, NP [facility]

## 2022-11-18 NOTE — Patient Instructions (Signed)
Be Involved in Your Health Care:  Taking Medications When medications are taken as directed, they can greatly improve your health. But if they are not taken as instructed, they may not work. In some cases, not taking them correctly can be harmful. To help ensure your treatment remains effective and safe, understand your medications and how to take them.  Your lab results, notes and after visit summary will be available on My Chart. We strongly encourage you to use this feature. If lab results are abnormal the clinic will contact you with the appropriate steps. If the clinic does not contact you assume the results are satisfactory. You can always see your results on My Chart. If you have questions regarding your condition, please contact the clinic during office hours. You can also ask questions on My Chart.  We at [facility] are grateful that you chose us to provide care. We strive to provide excellent and compassionate care and are always looking for feedback. If you get a survey from the clinic please complete this.   Food Basics for Chronic Kidney Disease Chronic kidney disease (CKD) is when your kidneys are not working well. They cannot remove waste, fluids, and other substances from your blood the way they should. These substances can build up, which can worsen kidney damage and affect how your body works. Eating certain foods can lead to a buildup of these substances. Changing your diet can help prevent more kidney damage. Diet changes may also delay dialysis or even keep you from needing it. What nutrients should I limit? Work with your treatment team and a food expert (dietitian) to make a meal plan that's right for you. Foods you can eat and foods you should limit or avoid will depend on the stage of your kidney disease and any other health conditions you have. The items listed below are not a complete list. Talk with your dietitian to learn what is best for  you. Potassium Potassium affects how steadily your heart beats. Too much potassium in your blood can cause an irregular heartbeat or even a heart attack. You may need to limit foods that are high in potassium, such as: Liquid milk and soy milk. Salt substitutes that contain potassium. Fruits like bananas, apricots, nectarines, melon, prunes, raisins, kiwi, and oranges. Vegetables, such as potatoes, sweet potatoes, yams, tomatoes, leafy greens, beets, avocado, pumpkin, and winter squash. Beans, like lima beans. Nuts. Phosphorus Phosphorus is a mineral found in your bones. You need a balance between calcium and phosphorus to build and maintain healthy bones. Too much added phosphorus from the foods you eat can pull calcium from your bones. Losing calcium can make your bones weak and more likely to break. Too much phosphorus can also make your skin itch. You may need to limit foods that are high in phosphorus or that have added phosphorus, such as: Liquid milk and dairy products. Dark-colored sodas or soft drinks. Bran cereals and oatmeal. Protein  Protein helps you make and keep muscle. Protein also helps to repair your body's cells and tissues. One of the natural breakdown products of protein is a waste product called urea. When your kidneys are not working well, they cannot remove types of waste like urea. Reducing protein in your diet can help keep urea from building up in your blood. Depending on your stage of kidney disease, you may need to eat smaller portions of foods that are high in protein. Sources of animal protein include: Meat (all types). Fish and   seafood. Poultry. Eggs. Dairy. Other protein foods include: Beans and legumes. Nuts and nut butter. Soy, like tofu.  Sodium Salt (sodium) helps to keep a healthy balance of fluids in your body. Too much salt can increase your blood pressure, which can harm your heart and lungs. Extra salt can also cause your body to keep too much  fluid, making your kidneys work harder. You may need to limit or avoid foods that are high in salt, such as: Salt seasonings. Soy and teriyaki sauce. Packaged, precooked, cured, or processed meats, such as sausages or meat loaves. Sardines. Salted crackers and snack foods. Fast food. Canned soups and most canned foods. Pickled foods. Vegetable juice. Boxed mixes or ready-to-eat boxed meals and side dishes. Bottled dressings, sauces, and marinades. Talk with your dietitian about how much potassium, phosphorus, protein, and salt you may have each day. Helpful tips Read food labels  Check the amount of salt in foods. Limit foods that have salt or sodium listed among the first five ingredients. Try to eat low-salt foods. Check the ingredient list for added phosphorus or potassium. "Phos" in an ingredient is a sign that phosphorus has been added. Do not buy foods that are calcium-enriched or that have calcium added to them (are fortified). Buy canned vegetables and beans that say "no salt added" and rinse them before eating. Lifestyle Limit the amount of protein you eat from animal sources each day. Focus on protein from plant sources, like tofu and dried beans, peas, and lentils. Do not add salt to food when cooking or before eating. Do not eat star fruit. It can be toxic for people with kidney problems. Talk with your health care provider before taking any vitamin or mineral supplements. If told by your health care provider, track how much liquid you drink so you can avoid drinking too much. You may need to include foods you eat that are made mostly from water, like gelatin, ice cream, soups, and juicy fruits and vegetables. If you have diabetes: If you have diabetes (diabetes mellitus) and CKD, you need to keep your blood sugar (glucose) in the target range recommended by your health care provider. Follow your diabetes management plan. This may include: Checking your blood glucose  regularly. Taking medicines by mouth, or taking insulin, or both. Exercising for at least 30 minutes on 5 or more days each week, or as told by your health care provider. Tracking how many servings of carbohydrates you eat at each meal. Not using orange juice to treat low blood sugars. Instead, use apple juice, cranberry juice, or clear soda. You may be given guidelines on what foods and nutrients you may eat, and how much you can have each day. This depends on your stage of kidney disease and whether you have high blood pressure (hypertension). Follow the meal plan your dietitian gives you. To learn more: National Institute of Diabetes and Digestive and Kidney Diseases: niddk.nih.gov National Kidney Foundation: kidney.org Summary Chronic kidney disease (CKD) is when your kidneys are not working well. They cannot remove waste, fluids, and other substances from your blood the way they should. These substances can build up, which can worsen kidney damage and affect how your body works. Changing your diet can help prevent more kidney damage. Diet changes may also delay dialysis or even keep you from needing it. Diet changes are different for each person with CKD. Work with a dietitian to set up a meal plan that is right for you. This information is not intended   to replace advice given to you by your health care provider. Make sure you discuss any questions you have with your health care provider. Document Revised: 10/14/2021 Document Reviewed: 10/20/2019 Elsevier Patient Education  2023 [employer] Inc.  

## 2022-11-20 ENCOUNTER — Encounter: Payer: Self-pay | Admitting: Nurse Practitioner

## 2022-11-20 ENCOUNTER — Ambulatory Visit (INDEPENDENT_AMBULATORY_CARE_PROVIDER_SITE_OTHER): Payer: BC Managed Care – PPO | Admitting: Nurse Practitioner

## 2022-11-20 VITALS — BP 123/73 | HR 86 | Temp 98.3°F | Ht 74.02 in | Wt 243.5 lb

## 2022-11-20 DIAGNOSIS — Z0000 Encounter for general adult medical examination without abnormal findings: Secondary | ICD-10-CM | POA: Diagnosis not present

## 2022-11-20 DIAGNOSIS — R7303 Prediabetes: Secondary | ICD-10-CM

## 2022-11-20 DIAGNOSIS — I10 Essential (primary) hypertension: Secondary | ICD-10-CM | POA: Diagnosis not present

## 2022-11-20 DIAGNOSIS — E7800 Pure hypercholesterolemia, unspecified: Secondary | ICD-10-CM

## 2022-11-20 DIAGNOSIS — E559 Vitamin D deficiency, unspecified: Secondary | ICD-10-CM | POA: Diagnosis not present

## 2022-11-20 DIAGNOSIS — E6609 Other obesity due to excess calories: Secondary | ICD-10-CM

## 2022-11-20 DIAGNOSIS — Z6831 Body mass index (BMI) 31.0-31.9, adult: Secondary | ICD-10-CM

## 2022-11-20 DIAGNOSIS — I2609 Other pulmonary embolism with acute cor pulmonale: Secondary | ICD-10-CM

## 2022-11-20 DIAGNOSIS — N1831 Chronic kidney disease, stage 3a: Secondary | ICD-10-CM

## 2022-11-20 DIAGNOSIS — I2782 Chronic pulmonary embolism: Secondary | ICD-10-CM | POA: Diagnosis not present

## 2022-11-20 LAB — MICROALBUMIN, URINE WAIVED
Creatinine, Urine Waived: 200 mg/dL (ref 10–300)
Microalb, Ur Waived: 80 mg/L — ABNORMAL HIGH (ref 0–19)

## 2022-11-20 LAB — BAYER DCA HB A1C WAIVED: HB A1C (BAYER DCA - WAIVED): 6.2 % — ABNORMAL HIGH (ref 4.8–5.6)

## 2022-11-20 MED ORDER — APIXABAN 5 MG PO TABS: 5.0000 mg | ORAL_TABLET | Freq: Two times a day (BID) | ORAL | 4 refills | Status: DC

## 2022-11-20 MED ORDER — ATORVASTATIN CALCIUM 40 MG PO TABS: 40.0000 mg | ORAL_TABLET | Freq: Every day | ORAL | 4 refills | Status: DC

## 2022-11-20 NOTE — Assessment & Plan Note (Signed)
Chronic, ongoing.  At this time continue medication and adjust as needed.  Lipid panel today. 

## 2022-11-20 NOTE — Assessment & Plan Note (Signed)
Chronic, fluctuating.  Noted past labs, recheck labs today and consider addition of ACE or ARB in future if would tolerate.  For now he prefers to avoid medications.

## 2022-11-20 NOTE — Assessment & Plan Note (Signed)
Chronic, ongoing.  Continues on supplement.  Recheck labs today.

## 2022-11-20 NOTE — Progress Notes (Signed)
BP 123/73   Pulse 86   Temp 98.3 F (36.8 C) (Oral)   Ht 6' 2.02" (1.88 m)   Wt 243 lb 8 oz (110.5 kg)   SpO2 95%   BMI 31.25 kg/m    Subjective:    Patient ID: Juan Russell, male    DOB: Jan 17, 1966, 57 y.o.   MRN: [id number]  HPI: Juan Russell is a 57 y.o. male presenting on 11/20/2022 for comprehensive medical examination. Current medical complaints include:none  He currently lives with: self Interim Problems from his last visit: no  HYPERLIPIDEMIA Continues on Atorvastatin 40 MG daily.   Is taking Eliquis for history of PE, he reports they told him to stay on this the rest of his life.  Last visit was 03/27/22.   Hyperlipidemia status: good compliance Satisfied with current treatment?  yes Side effects:  no Medication compliance: good compliance Supplements: none Aspirin:  no The 10-year ASCVD risk score (Arnett DK, et al., 2019) is: 6.5%   Values used to calculate the score:     Age: 23 years     Sex: Male     Is Non-Hispanic African American: Yes     Diabetic: No     Tobacco smoker: No     Systolic Blood Pressure: 123 mmHg     Is BP treated: No     HDL Cholesterol: 43 mg/dL     Total Cholesterol: 176 mg/dL  Chest pain:  no Coronary artery disease:  no Family history CAD:  yes, father Family history early CAD:  no    CHRONIC KIDNEY DISEASE Stable recent labs stable.  A1c 5.8% August 2023. CKD status: stable Medications renally dose: yes Previous renal evaluation: no Pneumovax:  Not up to Date Influenza Vaccine:  Not up to Date   Functional Status Survey: Is the patient deaf or have difficulty hearing?: No Does the patient have difficulty seeing, even when wearing glasses/contacts?: No Does the patient have difficulty concentrating, remembering, or making decisions?: No Does the patient have difficulty walking or climbing stairs?: No Does the patient have difficulty dressing or bathing?: No Does the patient have difficulty doing errands alone such as  visiting a doctor's office or shopping?: No     07/01/2018    1:50 PM 12/16/2020   11:22 PM 03/21/2022    2:02 PM 05/22/2022    1:36 PM  Fall Risk  Falls in the past year? 0  0 0  Was there an injury with Fall? 0  0 0  Fall Risk Category Calculator 0  0 0  Fall Risk Category (Retired) Low  Low Low  (RETIRED) Patient Fall Risk Level Low fall risk Low fall risk Low fall risk   Patient at Risk for Falls Due to   No Fall Risks No Fall Risks  Fall risk Follow up Falls evaluation completed  Falls evaluation completed Falls evaluation completed       11/20/2022    1:33 PM 05/22/2022    1:59 PM 03/21/2022    2:03 PM 07/07/2019    1:34 PM 07/01/2018    1:50 PM  Depression screen PHQ 2/9  Decreased Interest 0 0 3 0 0  Down, Depressed, Hopeless 0 0 0 0 0  PHQ - 2 Score 0 0 3 0 0  Altered sleeping 3 1 0 0 0  Tired, decreased energy 1 0 0 0 2  Change in appetite 0 0 0 0 0  Feeling bad or failure about yourself  0  0 0 0 0  Trouble concentrating 2 0 0 0 0  Moving slowly or fidgety/restless 0 0 0 0 0  Suicidal thoughts 0 0 0 0 0  PHQ-9 Score 6 1 3  0 2  Difficult doing work/chores  Not difficult at all Not difficult at all Not difficult at all Not difficult at all       11/20/2022    1:33 PM 05/22/2022    1:59 PM 03/21/2022    2:03 PM 07/01/2018    1:49 PM  GAD 7 : Generalized Anxiety Score  Nervous, Anxious, on Edge 0 0 0 0  Control/stop worrying 0 0 0 0  Worry too much - different things 0 0 0 0  Trouble relaxing 0 0 0 0  Restless 0 0 0 0  Easily annoyed or irritable 0 0 0 1  Afraid - awful might happen 0 0  0  Total GAD 7 Score 0 0  1  Anxiety Difficulty  Not difficult at all Not difficult at all Not difficult at all   Past Medical History:  Past Medical History:  Diagnosis Date   BPH (benign prostatic hyperplasia)    H/O blood clots     Surgical History:  Past Surgical History:  Procedure Laterality Date   EYE SURGERY     as a child    Medications:  Current  Outpatient Medications on File Prior to Visit  Medication Sig   apixaban (ELIQUIS) 5 MG TABS tablet Take 1 tablet (5 mg total) by mouth 2 (two) times daily.   atorvastatin (LIPITOR) 40 MG tablet Take 1 tablet (40 mg total) by mouth daily.   CALCIUM PO Take 600 mg by mouth every other day.   Cholecalciferol 1.25 MG (50000 UT) TABS Take 1 tablet by mouth once a week. For 8 weeks and then stop.  Return to office for lab draw.   CINNAMON PO Take by mouth daily.   GARLIC PO Take by mouth daily.   VITAMIN D PO Take by mouth daily.   No current facility-administered medications on file prior to visit.    Allergies:  Allergies  Allergen Reactions   Pollen Extract Itching    Redness in eyes, scratchy throat    Social History:  Social History   Socioeconomic History   Marital status: Single    Spouse name: Not on file   Number of children: Not on file   Years of education: Not on file   Highest education level: Not on file  Occupational History   Not on file  Tobacco Use   Smoking status: Never   Smokeless tobacco: Never  Vaping Use   Vaping Use: Never used  Substance and Sexual Activity   Alcohol use: No    Comment: Quit a month ago   Drug use: No   Sexual activity: Not Currently  Other Topics Concern   Not on file  Social History Narrative   Not on file   Social Determinants of Health   Financial Resource Strain: Not on file  Food Insecurity: Not on file  Transportation Needs: Not on file  Physical Activity: Not on file  Stress: Not on file  Social Connections: Not on file  Intimate Partner Violence: Not on file   Social History   Tobacco Use  Smoking Status Never  Smokeless Tobacco Never   Social History   Substance and Sexual Activity  Alcohol Use No   Comment: Quit a month ago    Family History:  Family History  Problem Relation Age of Onset   Diabetes Mother    Alcohol abuse Father    Stroke Father    Asthma Brother    Hypertension Brother     Cirrhosis Brother    Past medical history, surgical history, medications, allergies, family history and social history reviewed with patient today and changes made to appropriate areas of the chart.   ROS All other ROS negative except what is listed above and in the HPI.      Objective:    BP 123/73   Pulse 86   Temp 98.3 F (36.8 C) (Oral)   Ht 6' 2.02" (1.88 m)   Wt 243 lb 8 oz (110.5 kg)   SpO2 95%   BMI 31.25 kg/m   Wt Readings from Last 3 Encounters:  11/20/22 243 lb 8 oz (110.5 kg)  05/22/22 247 lb 11.2 oz (112.4 kg)  03/21/22 250 lb 12.8 oz (113.8 kg)    Physical Exam Vitals and nursing note reviewed.  Constitutional:      General: He is awake. He is not in acute distress.    Appearance: He is well-developed and well-groomed. He is obese. He is not ill-appearing or toxic-appearing.  HENT:     Head: Normocephalic and atraumatic.     Right Ear: Hearing, tympanic membrane, ear canal and external ear normal. No drainage.     Left Ear: Hearing, tympanic membrane, ear canal and external ear normal. No drainage.     Nose: Nose normal.     Mouth/Throat:     Pharynx: Uvula midline.  Eyes:     General: Lids are normal.        Right eye: No discharge.        Left eye: No discharge.     Extraocular Movements: Extraocular movements intact.     Conjunctiva/sclera: Conjunctivae normal.     Pupils: Pupils are equal, round, and reactive to light.     Visual Fields: Right eye visual fields normal and left eye visual fields normal.  Neck:     Thyroid: No thyromegaly.     Vascular: No carotid bruit or JVD.     Trachea: Trachea normal.  Cardiovascular:     Rate and Rhythm: Normal rate and regular rhythm.     Heart sounds: Normal heart sounds, S1 normal and S2 normal. No murmur heard.    No gallop.  Pulmonary:     Effort: Pulmonary effort is normal. No accessory muscle usage or respiratory distress.     Breath sounds: Normal breath sounds.  Abdominal:     General: Bowel  sounds are normal.     Palpations: Abdomen is soft. There is no hepatomegaly or splenomegaly.     Tenderness: There is no abdominal tenderness.  Musculoskeletal:        General: Normal range of motion.     Cervical back: Normal range of motion and neck supple.     Right lower leg: No edema.     Left lower leg: No edema.  Lymphadenopathy:     Head:     Right side of head: No submental, submandibular, tonsillar, preauricular or posterior auricular adenopathy.     Left side of head: No submental, submandibular, tonsillar, preauricular or posterior auricular adenopathy.     Cervical: No cervical adenopathy.  Skin:    General: Skin is warm and dry.     Capillary Refill: Capillary refill takes less than 2 seconds.     Findings: No rash.  Neurological:     Mental Status: He is alert and oriented to person, place, and time.     Gait: Gait is intact.     Deep Tendon Reflexes: Reflexes are normal and symmetric.     Reflex Scores:      Brachioradialis reflexes are 2+ on the right side and 2+ on the left side.      Patellar reflexes are 2+ on the right side and 2+ on the left side. Psychiatric:        Attention and Perception: Attention normal.        Mood and Affect: Mood normal.        Speech: Speech normal.        Behavior: Behavior normal. Behavior is cooperative.        Thought Content: Thought content normal.        Cognition and Memory: Cognition normal.     Results for orders placed or performed in visit on 05/22/22  Comprehensive metabolic panel  Result Value Ref Range   Glucose 94 70 - 99 mg/dL   BUN 13 6 - 24 mg/dL   Creatinine, Ser 4.09 (H) 0.76 - 1.27 mg/dL   eGFR 66 >81 XB/JYN/8.29   BUN/Creatinine Ratio 10 9 - 20   Sodium 142 134 - 144 mmol/L   Potassium 4.1 3.5 - 5.2 mmol/L   Chloride 107 (H) 96 - 106 mmol/L   CO2 21 20 - 29 mmol/L   Calcium 9.2 8.7 - 10.2 mg/dL   Total Protein 6.2 6.0 - 8.5 g/dL   Albumin 4.1 3.8 - 4.9 g/dL   Globulin, Total 2.1 1.5 - 4.5 g/dL    Albumin/Globulin Ratio 2.0 1.2 - 2.2   Bilirubin Total 0.3 0.0 - 1.2 mg/dL   Alkaline Phosphatase 107 44 - 121 IU/L   AST 19 0 - 40 IU/L   ALT 17 0 - 44 IU/L  Lipid Panel w/o Chol/HDL Ratio  Result Value Ref Range   Cholesterol, Total 176 100 - 199 mg/dL   Triglycerides 562 (H) 0 - 149 mg/dL   HDL 43 >13 mg/dL   VLDL Cholesterol Cal 27 5 - 40 mg/dL   LDL Chol Calc (NIH) 086 (H) 0 - 99 mg/dL      Assessment & Plan:   Problem List Items Addressed This Visit       Cardiovascular and Mediastinum   Hypertension    Stable at this time with BP at goal without medications.  Recommend he monitor BP at least a few mornings a week at home and document.  DASH diet at home.  Continue current medication regimen and adjust as needed.  Labs today: CBC, CMP, TSH, urine ALB.  Urine ALB 80 May 2024, may benefit Ace or ARB in future, but for now prefers focusing on diet.  Cardiology and sleep study referrals placed in past, never attended and is not interested.       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  Wishes not to return to hematology.  Appreciate their input.      Relevant Orders   CBC with Differential/Platelet  Comprehensive metabolic panel     Genitourinary   Stage 3a chronic kidney disease (HCC) - Primary    Chronic, fluctuating.  Noted past labs, recheck labs today and consider addition of ACE or ARB in future if would tolerate.  For now he prefers to avoid medications.      Relevant Orders   Microalbumin, Urine Waived   Comprehensive metabolic panel     Other   Hypercholesteremia    Chronic, ongoing.   At this time continue medication and adjust as needed.  Lipid panel today.      Relevant Orders   Comprehensive metabolic panel   Lipid Panel w/o Chol/HDL Ratio   Obesity    BMI 31.25.  Recommended eating smaller high protein, low fat meals more frequently and exercising 30 mins a day 5 times a week with a goal of 10-15lb weight loss in the next 3 months. Patient voiced their understanding and motivation to adhere to these recommendations.       Pre-diabetes    Noted on past labs August 2023 at 5.8%, he reports he has been working on diet changes.  Discussed at length with him today.  Today A1c 6.2%, recommend he continue to focus on diet and regular exercise.  His mother had diabetes.      Relevant Orders   Bayer DCA Hb A1c Waived   Microalbumin, Urine Waived   Vitamin D deficiency    Chronic, ongoing.  Continues on supplement.  Recheck labs today.      Relevant Orders   VITAMIN D 25 Hydroxy (Vit-D Deficiency, Fractures)   Other Visit Diagnoses     Encounter for annual physical exam       Annual physical today and health maintenance reviewed, he refuses colonoscopy.       Discussed aspirin prophylaxis for myocardial infarction prevention and decision was it was not indicated  LABORATORY TESTING:  Health maintenance labs ordered today as discussed above.   The natural history of prostate cancer and ongoing controversy regarding screening and potential treatment outcomes of prostate cancer has been discussed with the patient. The meaning of a false positive PSA and a false negative PSA has been discussed. He indicates understanding of the limitations of this screening test and wishes to proceed with screening PSA testing.  Last checked 03/21/22.   IMMUNIZATIONS:   - Tdap: Tetanus vaccination status reviewed: last tetanus booster within 10 years. - Influenza: Refused - Pneumovax: Not applicable - Prevnar: Not applicable - Zostavax vaccine: Refused  SCREENING: -  Colonoscopy: Refused  Discussed with patient purpose of the colonoscopy is to detect colon cancer at curable precancerous or early stages   - AAA Screening: Not applicable  -Hearing Test: Not applicable  -Spirometry: Not applicable   PATIENT COUNSELING:    Sexuality: Discussed sexually transmitted diseases, partner selection, use of condoms, avoidance of unintended pregnancy  and contraceptive alternatives.   Advised to avoid cigarette smoking.  I discussed with the patient that most people either abstain from alcohol or drink within safe limits (<=14/week and <=4 drinks/occasion for males, <=7/weeks and <= 3 drinks/occasion for females) and that the risk for alcohol disorders and other health effects rises proportionally with the number of drinks per week and how often a drinker exceeds daily limits.  Discussed cessation/primary prevention of drug use and availability of treatment for abuse.   Diet: Encouraged to adjust caloric intake to maintain  or achieve ideal body weight, to reduce intake of dietary saturated fat and total fat,  to limit sodium intake by avoiding high sodium foods and not adding table salt, and to maintain adequate dietary potassium and calcium preferably from fresh fruits, vegetables, and low-fat dairy products.    Stressed the importance of regular exercise  Injury prevention: Discussed safety belts, safety helmets, smoke detector, smoking near bedding or upholstery.   Dental health: Discussed importance of regular tooth brushing, flossing, and dental visits.   Follow up plan: NEXT PREVENTATIVE PHYSICAL DUE IN 1 YEAR. Return in about 6 months (around 05/23/2023) for H/O PE, HTN, HLD, CKD.

## 2022-11-20 NOTE — Assessment & Plan Note (Signed)
Noted on past labs August 2023 at 5.8%, he reports he has been working on diet changes.  Discussed at length with him today.  Today A1c 6.2%, recommend he continue to focus on diet and regular exercise.  His mother had diabetes.

## 2022-11-20 NOTE — Assessment & Plan Note (Signed)
Stable at this time with BP at goal without medications.  Recommend he monitor BP at least a few mornings a week at home and document.  DASH diet at home.  Continue current medication regimen and adjust as needed.  Labs today: CBC, CMP, TSH, urine ALB.  Urine ALB 80 May 2024, may benefit Ace or ARB in future, but for now prefers focusing on diet.  Cardiology and sleep study referrals placed in past, never attended and is not interested.

## 2022-11-20 NOTE — Assessment & Plan Note (Signed)
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  Wishes not to return to hematology.  Appreciate their input.

## 2022-11-20 NOTE — Assessment & Plan Note (Signed)
BMI 31.25.  Recommended eating smaller high protein, low fat meals more frequently and exercising 30 mins a day 5 times a week with a goal of 10-15lb weight loss in the next 3 months. Patient voiced their understanding and motivation to adhere to these recommendations.  

## 2022-11-21 ENCOUNTER — Other Ambulatory Visit: Payer: Self-pay | Admitting: Nurse Practitioner

## 2022-11-21 LAB — COMPREHENSIVE METABOLIC PANEL
ALT: 15 IU/L (ref 0–44)
AST: 18 IU/L (ref 0–40)
Albumin/Globulin Ratio: 1.6 (ref 1.2–2.2)
Albumin: 3.9 g/dL (ref 3.8–4.9)
Alkaline Phosphatase: 105 IU/L (ref 44–121)
BUN/Creatinine Ratio: 14 (ref 9–20)
BUN: 17 mg/dL (ref 6–24)
Bilirubin Total: 0.3 mg/dL (ref 0.0–1.2)
CO2: 21 mmol/L (ref 20–29)
Calcium: 9 mg/dL (ref 8.7–10.2)
Chloride: 106 mmol/L (ref 96–106)
Creatinine, Ser: 1.19 mg/dL (ref 0.76–1.27)
Globulin, Total: 2.4 g/dL (ref 1.5–4.5)
Glucose: 157 mg/dL — ABNORMAL HIGH (ref 70–99)
Potassium: 3.9 mmol/L (ref 3.5–5.2)
Sodium: 140 mmol/L (ref 134–144)
Total Protein: 6.3 g/dL (ref 6.0–8.5)
eGFR: 72 mL/min/{1.73_m2} (ref >59)

## 2022-11-21 LAB — LIPID PANEL W/O CHOL/HDL RATIO
Cholesterol, Total: 143 mg/dL (ref 100–199)
HDL: 37 mg/dL — ABNORMAL LOW (ref >39)
LDL Chol Calc (NIH): 71 mg/dL (ref 0–99)
Triglycerides: 210 mg/dL — ABNORMAL HIGH (ref 0–149)
VLDL Cholesterol Cal: 35 mg/dL (ref 5–40)

## 2022-11-21 LAB — CBC WITH DIFFERENTIAL/PLATELET
Basophils Absolute: 0 10*3/uL (ref 0.0–0.2)
Basos: 0 %
EOS (ABSOLUTE): 0.2 10*3/uL (ref 0.0–0.4)
Eos: 4 %
Hematocrit: 43.1 % (ref 37.5–51.0)
Hemoglobin: 14.2 g/dL (ref 13.0–17.7)
Immature Grans (Abs): 0 10*3/uL (ref 0.0–0.1)
Immature Granulocytes: 0 %
Lymphocytes Absolute: 1.5 10*3/uL (ref 0.7–3.1)
Lymphs: 35 %
MCH: 28.9 pg (ref 26.6–33.0)
MCHC: 32.9 g/dL (ref 31.5–35.7)
MCV: 88 fL (ref 79–97)
Monocytes Absolute: 0.4 10*3/uL (ref 0.1–0.9)
Monocytes: 9 %
Neutrophils Absolute: 2.2 10*3/uL (ref 1.4–7.0)
Neutrophils: 52 %
Platelets: 255 10*3/uL (ref 150–450)
RBC: 4.91 x10E6/uL (ref 4.14–5.80)
RDW: 13.5 % (ref 11.6–15.4)
WBC: 4.2 10*3/uL (ref 3.4–10.8)

## 2022-11-21 LAB — VITAMIN D 25 HYDROXY (VIT D DEFICIENCY, FRACTURES): Vit D, 25-Hydroxy: 16.6 ng/mL — ABNORMAL LOW (ref 30.0–100.0)

## 2022-11-21 LAB — TSH: TSH: 1.56 u[IU]/mL (ref 0.450–4.500)

## 2022-11-21 MED ORDER — CHOLECALCIFEROL 1.25 MG (50000 UT) PO TABS: 1.0000 | ORAL_TABLET | ORAL | 4 refills | Status: AC

## 2022-11-21 NOTE — Progress Notes (Signed)
Good day, please let Juan Russell know his labs have returned: - Kidney and liver function remain stable!!  Glucose (sugar) was elevated at 157, but A1c when we checked was still in prediabetic range at 6.2% -- as we discussed focus heavily on diet, even watching fruit intake as many fruits have a good amount sugar content. - Cholesterol levels show stable LDL (bad cholesterol) level. Triglycerides still a little elevated, I recommend continue Atorvastatin for prevention and add on some daily Omega supplement + eat more fish. - Vitamin D level remains a little low, I will resend your supplement to take weekly, please do this as is good for overall bone health. - Remainder of labs all stable. Any questions? Keep being stellar!!  Thank you for allowing me to participate in your care.  I appreciate you. Kindest regards, Sienna Stonehocker

## 2023-05-28 ENCOUNTER — Ambulatory Visit: Payer: Self-pay | Admitting: Nurse Practitioner

## 2023-05-28 DIAGNOSIS — E6609 Other obesity due to excess calories: Secondary | ICD-10-CM

## 2023-05-28 DIAGNOSIS — E559 Vitamin D deficiency, unspecified: Secondary | ICD-10-CM

## 2023-05-28 DIAGNOSIS — I10 Essential (primary) hypertension: Secondary | ICD-10-CM

## 2023-05-28 DIAGNOSIS — E7800 Pure hypercholesterolemia, unspecified: Secondary | ICD-10-CM

## 2023-05-28 DIAGNOSIS — N1831 Chronic kidney disease, stage 3a: Secondary | ICD-10-CM

## 2023-05-28 DIAGNOSIS — R7303 Prediabetes: Secondary | ICD-10-CM

## 2023-05-28 DIAGNOSIS — N400 Enlarged prostate without lower urinary tract symptoms: Secondary | ICD-10-CM

## 2023-05-28 DIAGNOSIS — I2609 Other pulmonary embolism with acute cor pulmonale: Secondary | ICD-10-CM

## 2023-07-10 ENCOUNTER — Ambulatory Visit: Payer: Self-pay | Admitting: Nurse Practitioner

## 2023-07-10 DIAGNOSIS — N1831 Chronic kidney disease, stage 3a: Secondary | ICD-10-CM

## 2023-07-10 DIAGNOSIS — E6609 Other obesity due to excess calories: Secondary | ICD-10-CM

## 2023-07-10 DIAGNOSIS — E559 Vitamin D deficiency, unspecified: Secondary | ICD-10-CM

## 2023-07-10 DIAGNOSIS — I10 Essential (primary) hypertension: Secondary | ICD-10-CM

## 2023-07-10 DIAGNOSIS — R7303 Prediabetes: Secondary | ICD-10-CM

## 2023-07-10 DIAGNOSIS — I2609 Other pulmonary embolism with acute cor pulmonale: Secondary | ICD-10-CM

## 2023-07-10 DIAGNOSIS — E7800 Pure hypercholesterolemia, unspecified: Secondary | ICD-10-CM

## 2023-09-09 NOTE — Patient Instructions (Signed)

## 2023-09-14 ENCOUNTER — Encounter: Payer: Self-pay | Admitting: Nurse Practitioner

## 2023-09-14 ENCOUNTER — Ambulatory Visit (INDEPENDENT_AMBULATORY_CARE_PROVIDER_SITE_OTHER): Payer: Self-pay | Admitting: Nurse Practitioner

## 2023-09-14 VITALS — BP 122/84 | HR 70 | Ht 74.0 in | Wt 253.8 lb

## 2023-09-14 DIAGNOSIS — E559 Vitamin D deficiency, unspecified: Secondary | ICD-10-CM

## 2023-09-14 DIAGNOSIS — N1831 Chronic kidney disease, stage 3a: Secondary | ICD-10-CM

## 2023-09-14 DIAGNOSIS — Z6831 Body mass index (BMI) 31.0-31.9, adult: Secondary | ICD-10-CM

## 2023-09-14 DIAGNOSIS — R7303 Prediabetes: Secondary | ICD-10-CM

## 2023-09-14 DIAGNOSIS — E66811 Obesity, class 1: Secondary | ICD-10-CM

## 2023-09-14 DIAGNOSIS — I2782 Chronic pulmonary embolism: Secondary | ICD-10-CM

## 2023-09-14 DIAGNOSIS — E6609 Other obesity due to excess calories: Secondary | ICD-10-CM

## 2023-09-14 DIAGNOSIS — I2609 Other pulmonary embolism with acute cor pulmonale: Secondary | ICD-10-CM

## 2023-09-14 DIAGNOSIS — I10 Essential (primary) hypertension: Secondary | ICD-10-CM

## 2023-09-14 DIAGNOSIS — N400 Enlarged prostate without lower urinary tract symptoms: Secondary | ICD-10-CM

## 2023-09-14 DIAGNOSIS — E7800 Pure hypercholesterolemia, unspecified: Secondary | ICD-10-CM

## 2023-09-14 DIAGNOSIS — R350 Frequency of micturition: Secondary | ICD-10-CM

## 2023-09-14 LAB — URINALYSIS, ROUTINE W REFLEX MICROSCOPIC
Bilirubin, UA: NEGATIVE
Glucose, UA: NEGATIVE
Ketones, UA: NEGATIVE
Leukocytes,UA: NEGATIVE
Nitrite, UA: NEGATIVE
Protein,UA: NEGATIVE
RBC, UA: NEGATIVE
Specific Gravity, UA: >1.03 — ABNORMAL HIGH (ref 1.005–1.030)
Urobilinogen, Ur: 1 mg/dL (ref 0.2–1.0)
pH, UA: 6 (ref 5.0–7.5)

## 2023-09-14 LAB — MICROALBUMIN, URINE WAIVED
Creatinine, Urine Waived: 300 mg/dL (ref 10–300)
Microalb, Ur Waived: 30 mg/L — ABNORMAL HIGH (ref 0–19)
Microalb/Creat Ratio: <30 mg/g (ref <30)

## 2023-09-14 LAB — BAYER DCA HB A1C WAIVED: HB A1C (BAYER DCA - WAIVED): 6.2 % — ABNORMAL HIGH (ref 4.8–5.6)

## 2023-09-14 MED ORDER — ATORVASTATIN CALCIUM 40 MG PO TABS: 40.0000 mg | ORAL_TABLET | Freq: Every day | ORAL | 4 refills | Status: AC

## 2023-09-14 MED ORDER — APIXABAN 5 MG PO TABS: 5.0000 mg | ORAL_TABLET | Freq: Two times a day (BID) | ORAL | 4 refills | Status: AC

## 2023-09-14 NOTE — Assessment & Plan Note (Signed)
BMI 32.59.  Recommended eating smaller high protein, low fat meals more frequently and exercising 30 mins a day 5 times a week with a goal of 10-15lb weight loss in the next 3 months. Patient voiced their understanding and motivation to adhere to these recommendations.

## 2023-09-14 NOTE — Assessment & Plan Note (Signed)
 Stable at this time with BP at goal without medications.  Recommend he monitor BP at least a few mornings a week at home and document.  DASH diet at home.  Continue current medication regimen and adjust as needed.  Labs today: CBC, CMP, TSH, urine ALB.  Urine ALB 07 October 2023, may benefit ACE or ARB in future, but for now prefers focusing on diet.  Cardiology and sleep study referrals placed in past, never attended and is not interested.

## 2023-09-14 NOTE — Assessment & Plan Note (Signed)
Chronic, fluctuating.  Noted past labs, recheck labs today and consider addition of ACE or ARB in future if would tolerate.  For now he prefers to avoid medications.

## 2023-09-14 NOTE — Assessment & Plan Note (Signed)
Chronic, ongoing.  At this time continue medication and adjust as needed.  Lipid panel today. 

## 2023-09-14 NOTE — Progress Notes (Signed)
 BP 122/84 (BP Location: Left Arm, Patient Position: Sitting, Cuff Size: Large)   Pulse 70   Ht 6\' 2"  (1.88 m)   Wt 253 lb 12.8 oz (115.1 kg)   SpO2 96%   BMI 32.59 kg/m    Subjective:    Patient ID: Juan Russell, male    DOB: May 05, 1966, 58 y.o.   MRN: [id number]  HPI: Juan Russell is a 58 y.o. male  Chief Complaint  Patient presents with   6 month follow up   Urinary Frequency   HYPERLIPIDEMIA Continues to take Atorvastatin 40 MG daily. Is taking Eliquis for history of PE 2 years ago, he reports they told him to stay on this the rest of his life.  Last visit was 03/27/22.   Hyperlipidemia status: good compliance Satisfied with current treatment?  yes Side effects:  no Medication compliance: good compliance Supplements: none Aspirin:  no The 10-year ASCVD risk score (Arnett DK, et al., 2019) is: 6.6%   Values used to calculate the score:     Age: 81 years     Sex: Male     Is Non-Hispanic African American: Yes     Diabetic: No     Tobacco smoker: No     Systolic Blood Pressure: 122 mmHg     Is BP treated: No     HDL Cholesterol: 37 mg/dL     Total Cholesterol: 143 mg/dL Chest pain:  no Coronary artery disease:  no Family history CAD:  yes, father Family history early CAD:  no    CHRONIC KIDNEY DISEASE Stable recent labs stable.  Taking Vitamin D for history of low levels. CKD status: stable Medications renally dose: yes Previous renal evaluation: no Pneumovax:  Not up to Date Influenza Vaccine:  Not up to Date   Impaired Fasting Glucose HbA1C:  Lab Results  Component Value Date   HGBA1C 6.2 (H) 09/14/2023  Duration of elevated blood sugar: years Polydipsia: no Polyuria: yes -- more frequency Weight change: no Visual disturbance: no Glucose Monitoring: no    Accucheck frequency: Not Checking    Fasting glucose:     Post prandial:  Diabetic Education: Not Completed Family history of diabetes: yes mother   BPH Has been having more frequency of  urination. Duration: months Nocturia: 1/night Urinary frequency:yes Incomplete voiding: yes Urgency: no Weak urinary stream: no Straining to start stream: no Dysuria: no Onset: gradual Severity: mild Alleviating factors: nothing Aggravating factors: unknown Treatments attempted: none IPSS Questionnaire (AUA-7): 3 AUA Over the past month.   1)  How often have you had a sensation of not emptying your bladder completely after you finish urinating?  1 - Less than 1 time in 5  2)  How often have you had to urinate again less than two hours after you finished urinating? 1 - Less than 1 time in 5  3)  How often have you found you stopped and started again several times when you urinated?  0 - Not at all  4) How difficult have you found it to postpone urination?  0 - Not at all  5) How often have you had a weak urinary stream?  0 - Not at all  6) How often have you had to push or strain to begin urination?  0 - Not at all  7) How many times did you most typically get up to urinate from the time you went to bed until the time you got up in the morning?  1 - 1 time  Total score:  0-7 mildly symptomatic   8-19 moderately symptomatic   20-35 severely symptomatic      09/14/2023    4:01 PM 11/20/2022    1:33 PM 05/22/2022    1:59 PM 03/21/2022    2:03 PM 07/07/2019    1:34 PM  Depression screen PHQ 2/9  Decreased Interest 3 0 0 3 0  Down, Depressed, Hopeless 0 0 0 0 0  PHQ - 2 Score 3 0 0 3 0  Altered sleeping 2 3 1  0 0  Tired, decreased energy 0 1 0 0 0  Change in appetite 0 0 0 0 0  Feeling bad or failure about yourself  1 0 0 0 0  Trouble concentrating 0 2 0 0 0  Moving slowly or fidgety/restless 0 0 0 0 0  Suicidal thoughts 0 0 0 0 0  PHQ-9 Score 6 6 1 3  0  Difficult doing work/chores Not difficult at all  Not difficult at all Not difficult at all Not difficult at all       09/14/2023    4:02 PM 11/20/2022    1:33 PM 05/22/2022    1:59 PM 03/21/2022    2:03 PM  GAD 7 :  Generalized Anxiety Score  Nervous, Anxious, on Edge 0 0 0 0  Control/stop worrying 0 0 0 0  Worry too much - different things 0 0 0 0  Trouble relaxing 1 0 0 0  Restless 0 0 0 0  Easily annoyed or irritable 0 0 0 0  Afraid - awful might happen 0 0 0   Total GAD 7 Score 1 0 0   Anxiety Difficulty Not difficult at all  Not difficult at all Not difficult at all   Relevant past medical, surgical, family and social history reviewed and updated as indicated. Interim medical history since our last visit reviewed. Allergies and medications reviewed and updated.  Review of Systems  Constitutional:  Negative for activity change, appetite change, diaphoresis, fatigue and fever.  Respiratory:  Negative for cough, chest tightness, shortness of breath and wheezing.   Cardiovascular:  Positive for leg swelling (sometimes if driving for a period). Negative for chest pain and palpitations.  Gastrointestinal: Negative.   Neurological: Negative.   Psychiatric/Behavioral:  Negative for decreased concentration, self-injury, sleep disturbance and suicidal ideas. The patient is not nervous/anxious.    Per HPI unless specifically indicated above     Objective:    BP 122/84 (BP Location: Left Arm, Patient Position: Sitting, Cuff Size: Large)   Pulse 70   Ht 6\' 2"  (1.88 m)   Wt 253 lb 12.8 oz (115.1 kg)   SpO2 96%   BMI 32.59 kg/m   Wt Readings from Last 3 Encounters:  09/14/23 253 lb 12.8 oz (115.1 kg)  11/20/22 243 lb 8 oz (110.5 kg)  05/22/22 247 lb 11.2 oz (112.4 kg)    Physical Exam Vitals and nursing note reviewed.  Constitutional:      General: He is awake. He is not in acute distress.    Appearance: He is well-developed and well-groomed. He is not ill-appearing or toxic-appearing.  HENT:     Head: Normocephalic and atraumatic.     Right Ear: Hearing, tympanic membrane, ear canal and external ear normal. No drainage.     Left Ear: Hearing, tympanic membrane, ear canal and external ear  normal. No drainage.     Nose: Nose normal.     Mouth/Throat:  Pharynx: Uvula midline.  Eyes:     General: Lids are normal.        Right eye: No discharge.        Left eye: No discharge.     Extraocular Movements: Extraocular movements intact.     Conjunctiva/sclera: Conjunctivae normal.     Pupils: Pupils are equal, round, and reactive to light.     Visual Fields: Right eye visual fields normal and left eye visual fields normal.  Neck:     Thyroid: No thyromegaly.     Vascular: No carotid bruit or JVD.     Trachea: Trachea normal.  Cardiovascular:     Rate and Rhythm: Normal rate and regular rhythm.     Heart sounds: Normal heart sounds, S1 normal and S2 normal. No murmur heard.    No gallop.  Pulmonary:     Effort: Pulmonary effort is normal. No accessory muscle usage or respiratory distress.     Breath sounds: Normal breath sounds.  Abdominal:     General: Bowel sounds are normal.     Palpations: Abdomen is soft. There is no hepatomegaly or splenomegaly.     Tenderness: There is no abdominal tenderness.  Musculoskeletal:        General: Normal range of motion.     Cervical back: Normal range of motion and neck supple.     Right lower leg: No edema.     Left lower leg: No edema.  Lymphadenopathy:     Head:     Right side of head: No submental, submandibular, tonsillar, preauricular or posterior auricular adenopathy.     Left side of head: No submental, submandibular, tonsillar, preauricular or posterior auricular adenopathy.     Cervical: No cervical adenopathy.  Skin:    General: Skin is warm and dry.     Capillary Refill: Capillary refill takes less than 2 seconds.     Findings: No rash.  Neurological:     Mental Status: He is alert and oriented to person, place, and time.     Gait: Gait is intact.     Deep Tendon Reflexes: Reflexes are normal and symmetric.     Reflex Scores:      Brachioradialis reflexes are 2+ on the right side and 2+ on the left side.       Patellar reflexes are 2+ on the right side and 2+ on the left side. Psychiatric:        Attention and Perception: Attention normal.        Mood and Affect: Mood normal.        Speech: Speech normal.        Behavior: Behavior normal. Behavior is cooperative.        Thought Content: Thought content normal.        Cognition and Memory: Cognition normal.    Results for orders placed or performed in visit on 09/14/23  Bayer DCA Hb A1c Waived   Collection Time: 09/14/23  3:42 PM  Result Value Ref Range   HB A1C (BAYER DCA - WAIVED) 6.2 (H) 4.8 - 5.6 %  Microalbumin, Urine Waived   Collection Time: 09/14/23  3:42 PM  Result Value Ref Range   Microalb, Ur Waived 30 (H) 0 - 19 mg/L   Creatinine, Urine Waived 300 10 - 300 mg/dL   Microalb/Creat Ratio <30 <30 mg/g  Urinalysis, Routine w reflex microscopic   Collection Time: 09/14/23  4:01 PM  Result Value Ref Range   Specific Gravity, UA >  1.030 (H) 1.005 - 1.030   pH, UA 6.0 5.0 - 7.5   Color, UA Yellow Yellow   Appearance Ur Clear Clear   Leukocytes,UA Negative Negative   Protein,UA Negative Negative/Trace   Glucose, UA Negative Negative   Ketones, UA Negative Negative   RBC, UA Negative Negative   Bilirubin, UA Negative Negative   Urobilinogen, Ur 1.0 0.2 - 1.0 mg/dL   Nitrite, UA Negative Negative   Microscopic Examination Comment       Assessment & Plan:   Problem List Items Addressed This Visit       Cardiovascular and Mediastinum   Chronic pulmonary embolism (HCC)   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  Wishes not to return to hematology.  Appreciate their input.      Relevant Medications   atorvastatin (LIPITOR) 40 MG tablet   apixaban (ELIQUIS) 5 MG TABS tablet   Hypertension    Stable at this time with BP at goal without medications.  Recommend he monitor BP at least a few mornings a week at home and document.  DASH diet at home.  Continue current medication regimen and adjust as needed.  Labs today: CBC, CMP, TSH, urine ALB.  Urine ALB 07 October 2023, may benefit ACE or ARB in future, but for now prefers focusing on diet.  Cardiology and sleep study referrals placed in past, never attended and is not interested.       Relevant Medications   atorvastatin (LIPITOR) 40 MG tablet   apixaban (ELIQUIS) 5 MG TABS tablet   Other Relevant Orders   Comprehensive metabolic panel   CBC with Differential/Platelet   TSH     Genitourinary   Stage 3a chronic kidney disease (HCC) - Primary   Chronic, fluctuating.  Noted past labs, recheck labs today and consider addition of ACE or ARB in future if would tolerate.  For now he prefers to avoid medications.      Relevant Orders   Microalbumin, Urine Waived (Completed)   Comprehensive metabolic panel   CBC with Differential/Platelet   PSA     Other   Hypercholesteremia   Chronic, ongoing.  At this time continue medication and adjust as needed.  Lipid panel today.      Relevant Medications   atorvastatin (LIPITOR) 40 MG tablet   apixaban (ELIQUIS) 5 MG TABS tablet   Other Relevant Orders   Comprehensive metabolic panel   Lipid Panel w/o Chol/HDL Ratio   Obesity   BMI 04.54.  Recommended eating smaller high protein, low fat meals more frequently and exercising 30 mins a day 5 times a week with a goal of 10-15lb weight loss in the next 3 months. Patient voiced their understanding and motivation to adhere to these recommendations.       Pre-diabetes   Ongoing.  A1c 6.2% today, same as last check.  Discussed with him.  Recommend he continue to focus on diet and regular exercise.  His mother had diabetes. Urine ALB 07 October 2023.      Relevant Orders   Bayer DCA Hb A1c Waived (Completed)   Microalbumin, Urine Waived  (Completed)   Vitamin D deficiency   Chronic, ongoing.  Continues on supplement.  Recheck labs today.      Other Visit Diagnoses  Urinary frequency       UA today negative.  ?more prostate related.   Relevant Orders   Urinalysis, Routine w reflex microscopic (Completed)     Benign prostatic hyperplasia without lower urinary tract symptoms       PSA on labs today.   Relevant Orders   VITAMIN D 25 Hydroxy (Vit-D Deficiency, Fractures)        Follow up plan: Return in about 6 months (around 03/16/2024) for HTN/HLD, PE, IFG.

## 2023-09-14 NOTE — Assessment & Plan Note (Signed)
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  Wishes not to return to hematology.  Appreciate their input.

## 2023-09-14 NOTE — Assessment & Plan Note (Signed)
Chronic, ongoing.  Continues on supplement.  Recheck labs today.

## 2023-09-14 NOTE — Assessment & Plan Note (Addendum)
 Ongoing.  A1c 6.2% today, same as last check.  Discussed with him.  Recommend he continue to focus on diet and regular exercise.  His mother had diabetes. Urine ALB 07 October 2023.

## 2023-09-15 LAB — COMPREHENSIVE METABOLIC PANEL
ALT: 15 IU/L (ref 0–44)
AST: 22 IU/L (ref 0–40)
Albumin: 4 g/dL (ref 3.8–4.9)
Alkaline Phosphatase: 105 IU/L (ref 44–121)
BUN/Creatinine Ratio: 12 (ref 9–20)
BUN: 14 mg/dL (ref 6–24)
Bilirubin Total: 0.4 mg/dL (ref 0.0–1.2)
CO2: 21 mmol/L (ref 20–29)
Calcium: 9.1 mg/dL (ref 8.7–10.2)
Chloride: 104 mmol/L (ref 96–106)
Creatinine, Ser: 1.19 mg/dL (ref 0.76–1.27)
Globulin, Total: 2.3 g/dL (ref 1.5–4.5)
Glucose: 117 mg/dL — ABNORMAL HIGH (ref 70–99)
Potassium: 3.9 mmol/L (ref 3.5–5.2)
Sodium: 141 mmol/L (ref 134–144)
Total Protein: 6.3 g/dL (ref 6.0–8.5)
eGFR: 71 mL/min/{1.73_m2} (ref >59)

## 2023-09-15 LAB — CBC WITH DIFFERENTIAL/PLATELET
Basophils Absolute: 0 10*3/uL (ref 0.0–0.2)
Basos: 0 %
EOS (ABSOLUTE): 0.2 10*3/uL (ref 0.0–0.4)
Eos: 4 %
Hematocrit: 43.9 % (ref 37.5–51.0)
Hemoglobin: 14.4 g/dL (ref 13.0–17.7)
Immature Grans (Abs): 0 10*3/uL (ref 0.0–0.1)
Immature Granulocytes: 0 %
Lymphocytes Absolute: 1.7 10*3/uL (ref 0.7–3.1)
Lymphs: 38 %
MCH: 29 pg (ref 26.6–33.0)
MCHC: 32.8 g/dL (ref 31.5–35.7)
MCV: 88 fL (ref 79–97)
Monocytes Absolute: 0.4 10*3/uL (ref 0.1–0.9)
Monocytes: 10 %
Neutrophils Absolute: 2.1 10*3/uL (ref 1.4–7.0)
Neutrophils: 48 %
Platelets: 263 10*3/uL (ref 150–450)
RBC: 4.97 x10E6/uL (ref 4.14–5.80)
RDW: 13.4 % (ref 11.6–15.4)
WBC: 4.5 10*3/uL (ref 3.4–10.8)

## 2023-09-15 LAB — LIPID PANEL W/O CHOL/HDL RATIO
Cholesterol, Total: 147 mg/dL (ref 100–199)
HDL: 40 mg/dL (ref >39)
LDL Chol Calc (NIH): 87 mg/dL (ref 0–99)
Triglycerides: 108 mg/dL (ref 0–149)
VLDL Cholesterol Cal: 20 mg/dL (ref 5–40)

## 2023-09-15 LAB — TSH: TSH: 3.3 u[IU]/mL (ref 0.450–4.500)

## 2023-09-15 LAB — PSA: Prostate Specific Ag, Serum: 0.5 ng/mL (ref 0.0–4.0)

## 2023-09-15 LAB — VITAMIN D 25 HYDROXY (VIT D DEFICIENCY, FRACTURES): Vit D, 25-Hydroxy: 28.3 ng/mL — ABNORMAL LOW (ref 30.0–100.0)

## 2023-09-15 NOTE — Progress Notes (Signed)
 Good morning crew, please let Dallyn know his labs have returned and overall remain stable with exception of ongoing lower Vitamin D level.  Please ensure to take Vitamin D3 2000 units daily.  Continue all current medications.  Any questions? Keep being amazing!!  Thank you for allowing me to participate in your care.  I appreciate you. Kindest regards, Nadean Montanaro

## 2023-12-03 ENCOUNTER — Emergency Department
Admission: EM | Admit: 2023-12-03 | Discharge: 2023-12-03 | Disposition: A | Discharge status: Home / Self Care | Payer: Self-pay | Attending: Emergency Medicine | Admitting: Emergency Medicine

## 2023-12-03 ENCOUNTER — Other Ambulatory Visit: Payer: Self-pay

## 2023-12-03 DIAGNOSIS — Z86711 Personal history of pulmonary embolism: Secondary | ICD-10-CM | POA: Insufficient documentation

## 2023-12-03 DIAGNOSIS — S39012A Strain of muscle, fascia and tendon of lower back, initial encounter: Secondary | ICD-10-CM | POA: Insufficient documentation

## 2023-12-03 DIAGNOSIS — N183 Chronic kidney disease, stage 3 unspecified: Secondary | ICD-10-CM | POA: Insufficient documentation

## 2023-12-03 DIAGNOSIS — X58XXXA Exposure to other specified factors, initial encounter: Secondary | ICD-10-CM | POA: Insufficient documentation

## 2023-12-03 LAB — COMPREHENSIVE METABOLIC PANEL WITH GFR
ALT: 21 U/L (ref 0–44)
AST: 29 U/L (ref 15–41)
Albumin: 3.9 g/dL (ref 3.5–5.0)
Alkaline Phosphatase: 80 U/L (ref 38–126)
Anion gap: 9 (ref 5–15)
BUN: 19 mg/dL (ref 6–20)
CO2: 24 mmol/L (ref 22–32)
Calcium: 7.8 mg/dL — ABNORMAL LOW (ref 8.9–10.3)
Chloride: 105 mmol/L (ref 98–111)
Creatinine, Ser: 1.22 mg/dL (ref 0.61–1.24)
GFR, Estimated: >60 mL/min (ref >60)
Glucose, Bld: 87 mg/dL (ref 70–99)
Potassium: 3.5 mmol/L (ref 3.5–5.1)
Sodium: 138 mmol/L (ref 135–145)
Total Bilirubin: 0.8 mg/dL (ref 0.0–1.2)
Total Protein: 7 g/dL (ref 6.5–8.1)

## 2023-12-03 LAB — URINALYSIS, COMPLETE (UACMP) WITH MICROSCOPIC
Bacteria, UA: NONE SEEN
Bilirubin Urine: NEGATIVE
Glucose, UA: NEGATIVE mg/dL
Hgb urine dipstick: NEGATIVE
Ketones, ur: NEGATIVE mg/dL
Leukocytes,Ua: NEGATIVE
Nitrite: NEGATIVE
Protein, ur: NEGATIVE mg/dL
Specific Gravity, Urine: 1.01 (ref 1.005–1.030)
pH: 5 (ref 5.0–8.0)

## 2023-12-03 LAB — CBC WITH DIFFERENTIAL/PLATELET
Abs Immature Granulocytes: 0.01 10*3/uL (ref 0.00–0.07)
Basophils Absolute: 0 10*3/uL (ref 0.0–0.1)
Basophils Relative: 0 %
Eosinophils Absolute: 0.2 10*3/uL (ref 0.0–0.5)
Eosinophils Relative: 6 %
HCT: 44.2 % (ref 39.0–52.0)
Hemoglobin: 14.8 g/dL (ref 13.0–17.0)
Immature Granulocytes: 0 %
Lymphocytes Relative: 45 %
Lymphs Abs: 1.8 10*3/uL (ref 0.7–4.0)
MCH: 29 pg (ref 26.0–34.0)
MCHC: 33.5 g/dL (ref 30.0–36.0)
MCV: 86.5 fL (ref 80.0–100.0)
Monocytes Absolute: 0.5 10*3/uL (ref 0.1–1.0)
Monocytes Relative: 13 %
Neutro Abs: 1.4 10*3/uL — ABNORMAL LOW (ref 1.7–7.7)
Neutrophils Relative %: 36 %
Platelets: 252 10*3/uL (ref 150–400)
RBC: 5.11 MIL/uL (ref 4.22–5.81)
RDW: 13.2 % (ref 11.5–15.5)
WBC: 4 10*3/uL (ref 4.0–10.5)
nRBC: 0 % (ref 0.0–0.2)

## 2023-12-03 MED ORDER — BACLOFEN 10 MG PO TABS: 10.0000 mg | ORAL_TABLET | Freq: Three times a day (TID) | ORAL | 0 refills | Status: AC

## 2023-12-03 NOTE — ED Triage Notes (Signed)
 Pt c/o bilateral flank pain since last night. Pt denies dysuria or hematuria. Pt reports having stage 3 kidney disease, but isn't on dialysis. Pt denies hx of renal stones. Pt describes the pain as burning and reports it gets worse with eating high sodium or high fat foods. Pt reports this has happened before but wants "treatment so I don't have to keep going to the doctor."

## 2023-12-03 NOTE — ED Notes (Signed)
 Assumed patient care and received report from [place], [place]

## 2023-12-03 NOTE — ED Provider Notes (Signed)
 [facility] Provider Note    Event Date/Time   First MD Initiated Contact with Patient 12/03/23 1624     (approximate)   History   Flank Pain   HPI  Juan Russell is a 58 y.o. male with history of BPH, blood clots, PE, stage III CKD presents emergency department with flank pain bilaterally since last night.  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  However he is concerned as his kidneys.  States he is not on dialysis.  He has been trying to change his diet.  Does not drink alcohol.  No history of renal stones.  He states he just does not want to have to keep getting treatment for his kidneys.      Physical Exam   Triage Vital Signs: ED Triage Vitals  Encounter Vitals Group     BP 12/03/23 1619 (!) 119/101     Systolic BP Percentile --      Diastolic BP Percentile --      Pulse Rate 12/03/23 1619 73     Resp 12/03/23 1619 17     Temp 12/03/23 1619 98.4 F (36.9 C)     Temp Source 12/03/23 1619 Oral     SpO2 12/03/23 1619 100 %     Weight 12/03/23 1620 253 lb (114.8 kg)     Height 12/03/23 1620 6\' 2"  (1.88 m)     Head Circumference --      Peak Flow --      Pain Score 12/03/23 1620 7     Pain Loc --      Pain Education --      Exclude from Growth Chart --     Most recent vital signs: Vitals:   12/03/23 1619  BP: (!) 119/101  Pulse: 73  Resp: 17  Temp: 98.4 F (36.9 C)  SpO2: 100%     General: Awake, no distress.   CV:  Good peripheral perfusion. regular rate and  rhythm Resp:  Normal effort. Lungs cta Abd:  No distention.  Nontender, no CVA tenderness Other:      ED Results / Procedures / Treatments   Labs (all labs ordered are listed, but only abnormal results are displayed) Labs Reviewed  CBC WITH DIFFERENTIAL/PLATELET - Abnormal; Notable for the following components:      Result Value   Neutro Abs 1.4 (*)    All other components within normal limits   COMPREHENSIVE METABOLIC PANEL WITH GFR - Abnormal; Notable for the following components:   Calcium  7.8 (*)    All other components within normal limits  URINALYSIS, COMPLETE (UACMP) WITH MICROSCOPIC - Abnormal; Notable for the following components:   Color, Urine STRAW (*)    APPearance CLEAR (*)    All other components within normal limits     EKG     RADIOLOGY     PROCEDURES:   Procedures  Critical Care:  no Chief Complaint  Patient presents with   Flank Pain      MEDICATIONS ORDERED IN ED: Medications - No data to display   IMPRESSION / MDM / ASSESSMENT AND PLAN / ED COURSE  I reviewed the triage vital signs and the nursing notes.                              Differential diagnosis includes, but is not  limited to, CKD, AKI, UTI, pyelonephritis, kidney stone, muscle strain  Patient's presentation is most consistent with exacerbation of chronic illness.   Cardiac monitor no Medications given: none  Due to the patient's history of stage III CKD will get labs.  Patient appears well, I do not see any red flags at this time to warrant imaging.  Labs are reassuring, GFR 73  I did explain all findings to the patient.  He is to continue to monitor his diet as he has been doing as this kidney disease has improved he was given a prescription for Robaxin for muscle strain.  Follow-up with his regular doctor as needed.  Return for worsening.  He was discharged stable condition.  All questions were answered.  Strict instructions to return if worsens     FINAL CLINICAL IMPRESSION(S) / ED DIAGNOSES   Final diagnoses:  Lumbar strain, initial encounter     Rx / DC Orders   ED Discharge Orders          Ordered    baclofen (LIORESAL) 10 MG tablet  3 times daily        12/03/23 1815             Note:  This document was prepared using Dragon voice recognition software and may include unintentional dictation errors.    Delsie Figures, PA-C 12/03/23  1816    Arline Bennett, MD 12/03/23 2223

## 2024-03-19 NOTE — Patient Instructions (Incomplete)
 Be Involved in Caring For Your Health:  Taking Medications When medications are taken as directed, they can greatly improve your health. But if they are not taken as prescribed, they may not work. In some cases, not taking them correctly can be harmful. To help ensure your treatment remains effective and safe, understand your medications and how to take them. Bring your medications to each visit for review by your provider.  Your lab results, notes, and after visit summary will be available on My Chart. We strongly encourage you to use this feature. If lab results are abnormal the clinic will contact you with the appropriate steps. If the clinic does not contact you assume the results are satisfactory. You can always view your results on My Chart. If you have questions regarding your health or results, please contact the clinic during office hours. You can also ask questions on My Chart.  We at [facility] are grateful that you chose us  to provide your care. We strive to provide evidence-based and compassionate care and are always looking for feedback. If you get a survey from the clinic please complete this so we can hear your opinions.  DASH Eating Plan DASH stands for Dietary Approaches to Stop Hypertension. The DASH eating plan is a healthy eating plan that has been shown to: Lower high blood pressure (hypertension). Reduce your risk for type 2 diabetes, heart disease, and stroke. Help with weight loss. What are tips for following this plan? Reading food labels Check food labels for the amount of salt (sodium) per serving. Choose foods with less than 5 percent of the Daily Value (DV) of sodium. In general, foods with less than 300 milligrams (mg) of sodium per serving fit into this eating plan. To find whole grains, look for the word whole as the first word in the ingredient list. Shopping Buy products labeled as low-sodium or no salt added. Buy fresh foods. Avoid canned  foods and pre-made or frozen meals. Cooking Try not to add salt when you cook. Use salt-free seasonings or herbs instead of table salt or sea salt. Check with your health care provider or pharmacist before using salt substitutes. Do not fry foods. Cook foods in healthy ways, such as baking, boiling, grilling, roasting, or broiling. Cook using oils that are good for your heart. These include olive, canola, avocado, soybean, and sunflower oil. Meal planning  Eat a balanced diet. This should include: 4 or more servings of fruits and 4 or more servings of vegetables each day. Try to fill half of your plate with fruits and vegetables. 6-8 servings of whole grains each day. 6 or less servings of lean meat, poultry, or fish each day. 1 oz is 1 serving. A 3 oz (85 g) serving of meat is about the same size as the palm of your hand. One egg is 1 oz (28 g). 2-3 servings of low-fat dairy each day. One serving is 1 cup (237 mL). 1 serving of nuts, seeds, or beans 5 times each week. 2-3 servings of heart-healthy fats. Healthy fats called omega-3 fatty acids are found in foods such as walnuts, flaxseeds, fortified milks, and eggs. These fats are also found in cold-water fish, such as sardines, salmon, and mackerel. Limit how much you eat of: Canned or prepackaged foods. Food that is high in trans fat, such as fried foods. Food that is high in saturated fat, such as fatty meat. Desserts and other sweets, sugary drinks, and other foods with added sugar. Full-fat  dairy products. Do not salt foods before eating. Do not eat more than 4 egg yolks a week. Try to eat at least 2 vegetarian meals a week. Eat more home-cooked food and less restaurant, buffet, and fast food. Lifestyle When eating at a restaurant, ask if your food can be made with less salt or no salt. If you drink alcohol: Limit how much you have to: 0-1 drink a day if you are male. 0-2 drinks a day if you are male. Know how much alcohol is in  your drink. In the [place] one drink is one 12 oz bottle of beer (355 mL), one 5 oz glass of wine (148 mL), or one 1 oz glass of hard liquor (44 mL). General information Avoid eating more than 2,300 mg of salt a day. If you have hypertension, you may need to reduce your sodium intake to 1,500 mg a day. Work with your provider to stay at a healthy body weight or lose weight. Ask what the best weight range is for you. On most days of the week, get at least 30 minutes of exercise that causes your heart to beat faster. This may include walking, swimming, or biking. Work with your provider or dietitian to adjust your eating plan to meet your specific calorie needs. What foods should I eat? Fruits All fresh, dried, or frozen fruit. Canned fruits that are in their natural juice and do not have sugar added to them. Vegetables Fresh or frozen vegetables that are raw, steamed, roasted, or grilled. Low-sodium or reduced-sodium tomato and vegetable juice. Low-sodium or reduced-sodium tomato sauce and tomato paste. Low-sodium or reduced-sodium canned vegetables. Grains Whole-grain or whole-wheat bread. Whole-grain or whole-wheat pasta. Brown rice. [place]ira. Bulgur. Whole-grain and low-sodium cereals. Pita bread. Low-fat, low-sodium crackers. Whole-wheat flour tortillas. Meats and other proteins Skinless chicken or [place]. Ground chicken or [place]. Pork with fat trimmed off. Fish and seafood. Egg whites. Dried beans, peas, or lentils. Unsalted nuts, nut butters, and seeds. Unsalted canned beans. Lean cuts of beef with fat trimmed off. Low-sodium, lean precooked or cured meat, such as sausages or meat loaves. Dairy Low-fat (1%) or fat-free (skim) milk. Reduced-fat, low-fat, or fat-free cheeses. Nonfat, low-sodium ricotta or cottage cheese. Low-fat or nonfat yogurt. Low-fat, low-sodium cheese. Fats and oils Soft margarine without trans fats. Vegetable oil. Reduced-fat, low-fat, or light mayonnaise and salad  dressings (reduced-sodium). Canola, safflower, olive, avocado, soybean, and sunflower oils. Avocado. Seasonings and condiments Herbs. Spices. Seasoning mixes without salt. Other foods Unsalted popcorn and pretzels. Fat-free sweets. The items listed above may not be all the foods and drinks you can have. Talk to a dietitian to learn more. What foods should I avoid? Fruits Canned fruit in a light or heavy syrup. Fried fruit. Fruit in cream or butter sauce. Vegetables Creamed or fried vegetables. Vegetables in a cheese sauce. Regular canned vegetables that are not marked as low-sodium or reduced-sodium. Regular canned tomato sauce and paste that are not marked as low-sodium or reduced-sodium. Regular tomato and vegetable juices that are not marked as low-sodium or reduced-sodium. Dene. Olives. Grains Baked goods made with fat, such as croissants, muffins, or some breads. Dry pasta or rice meal packs. Meats and other proteins Fatty cuts of meat. Ribs. Fried meat. [place]. Bologna, salami, and other precooked or cured meats, such as sausages or meat loaves, that are not lean and low in sodium. Fat from the back of a pig (fatback). Bratwurst. Salted nuts and seeds. Canned beans with added salt. Canned  or smoked fish. Whole eggs or egg yolks. Chicken or [place] with skin. Dairy Whole or 2% milk, cream, and half-and-half. Whole or full-fat cream cheese. Whole-fat or sweetened yogurt. Full-fat cheese. Nondairy creamers. Whipped toppings. Processed cheese and cheese spreads. Fats and oils Butter. Stick margarine. Lard. Shortening. Ghee. Bacon fat. Tropical oils, such as coconut, palm kernel, or palm oil. Seasonings and condiments Onion salt, garlic salt, seasoned salt, table salt, and sea salt. Worcestershire sauce. Tartar sauce. Barbecue sauce. Teriyaki sauce. Soy sauce, including reduced-sodium soy sauce. Steak sauce. Canned and packaged gravies. Fish sauce. Oyster sauce. Cocktail sauce. Store-bought  horseradish. Ketchup. Mustard. Meat flavorings and tenderizers. Bouillon cubes. Hot sauces. Pre-made or packaged marinades. Pre-made or packaged taco seasonings. Relishes. Regular salad dressings. Other foods Salted popcorn and pretzels. The items listed above may not be all the foods and drinks you should avoid. Talk to a dietitian to learn more. Where to find more information National Heart, Lung, and Blood Institute (NHLBI): BuffaloDryCleaner.gl American Heart Association (AHA): heart.org Academy of Nutrition and Dietetics: eatright.org National Kidney Foundation (NKF): kidney.org This information is not intended to replace advice given to you by your health care provider. Make sure you discuss any questions you have with your health care provider. Document Revised: 07/13/2022 Document Reviewed: 07/13/2022 Elsevier Patient Education  2024 [employer].

## 2024-03-21 ENCOUNTER — Ambulatory Visit: Payer: Self-pay | Admitting: Nurse Practitioner

## 2024-03-21 DIAGNOSIS — I2609 Other pulmonary embolism with acute cor pulmonale: Secondary | ICD-10-CM

## 2024-03-21 DIAGNOSIS — E6609 Other obesity due to excess calories: Secondary | ICD-10-CM

## 2024-03-21 DIAGNOSIS — R7303 Prediabetes: Secondary | ICD-10-CM

## 2024-03-21 DIAGNOSIS — Z23 Encounter for immunization: Secondary | ICD-10-CM

## 2024-03-21 DIAGNOSIS — E7800 Pure hypercholesterolemia, unspecified: Secondary | ICD-10-CM

## 2024-03-21 DIAGNOSIS — N1831 Chronic kidney disease, stage 3a: Secondary | ICD-10-CM

## 2024-03-21 DIAGNOSIS — I10 Essential (primary) hypertension: Secondary | ICD-10-CM
